# Patient Record
Sex: Female | Born: 1960 | Race: White | Hispanic: No | Marital: Married | Smoking: Never smoker
Health system: Southern US, Community
[De-identification: ages and names within clinical notes are randomized; demographics above are authoritative.]

## PROBLEM LIST (undated history)

## (undated) DIAGNOSIS — F329 Major depressive disorder, single episode, unspecified: Secondary | ICD-10-CM

## (undated) DIAGNOSIS — R87810 Cervical high risk human papillomavirus (HPV) DNA test positive: Secondary | ICD-10-CM

## (undated) DIAGNOSIS — R87610 Atypical squamous cells of undetermined significance on cytologic smear of cervix (ASC-US): Secondary | ICD-10-CM

## (undated) DIAGNOSIS — L659 Nonscarring hair loss, unspecified: Secondary | ICD-10-CM

## (undated) DIAGNOSIS — B977 Papillomavirus as the cause of diseases classified elsewhere: Secondary | ICD-10-CM

## (undated) DIAGNOSIS — M069 Rheumatoid arthritis, unspecified: Secondary | ICD-10-CM

## (undated) DIAGNOSIS — B009 Herpesviral infection, unspecified: Secondary | ICD-10-CM

## (undated) DIAGNOSIS — IMO0002 Reserved for concepts with insufficient information to code with codable children: Secondary | ICD-10-CM

## (undated) DIAGNOSIS — M8580 Other specified disorders of bone density and structure, unspecified site: Secondary | ICD-10-CM

## (undated) DIAGNOSIS — F32A Depression, unspecified: Secondary | ICD-10-CM

## (undated) HISTORY — PX: COLONOSCOPY: SHX174

## (undated) HISTORY — PX: BREAST SURGERY: SHX581

## (undated) HISTORY — DX: Other specified disorders of bone density and structure, unspecified site: M85.80

## (undated) HISTORY — DX: Major depressive disorder, single episode, unspecified: F32.9

## (undated) HISTORY — DX: Nonscarring hair loss, unspecified: L65.9

## (undated) HISTORY — DX: Cervical high risk human papillomavirus (HPV) DNA test positive: R87.810

## (undated) HISTORY — DX: Atypical squamous cells of undetermined significance on cytologic smear of cervix (ASC-US): R87.610

## (undated) HISTORY — DX: Reserved for concepts with insufficient information to code with codable children: IMO0002

## (undated) HISTORY — PX: BREAST EXCISIONAL BIOPSY: SUR124

## (undated) HISTORY — DX: Depression, unspecified: F32.A

## (undated) HISTORY — DX: Rheumatoid arthritis, unspecified: M06.9

## (undated) HISTORY — DX: Herpesviral infection, unspecified: B00.9

## (undated) HISTORY — DX: Papillomavirus as the cause of diseases classified elsewhere: B97.7

---

## 1998-05-22 ENCOUNTER — Other Ambulatory Visit: Admission: RE | Admit: 1998-05-22 | Discharge: 1998-05-22 | Payer: Self-pay | Admitting: Gynecology

## 1999-07-06 ENCOUNTER — Other Ambulatory Visit: Admission: RE | Admit: 1999-07-06 | Discharge: 1999-07-06 | Payer: Self-pay | Admitting: Obstetrics and Gynecology

## 2000-10-24 DIAGNOSIS — M069 Rheumatoid arthritis, unspecified: Secondary | ICD-10-CM

## 2000-10-24 HISTORY — PX: FINGER SURGERY: SHX640

## 2000-10-24 HISTORY — DX: Rheumatoid arthritis, unspecified: M06.9

## 2001-03-02 ENCOUNTER — Ambulatory Visit (HOSPITAL_BASED_OUTPATIENT_CLINIC_OR_DEPARTMENT_OTHER): Admission: RE | Admit: 2001-03-02 | Discharge: 2001-03-02 | Payer: Self-pay | Admitting: Orthopedic Surgery

## 2001-03-02 ENCOUNTER — Encounter (INDEPENDENT_AMBULATORY_CARE_PROVIDER_SITE_OTHER): Payer: Self-pay | Admitting: Specialist

## 2006-01-11 ENCOUNTER — Other Ambulatory Visit: Admission: RE | Admit: 2006-01-11 | Discharge: 2006-01-11 | Payer: Self-pay | Admitting: Gynecology

## 2007-01-24 ENCOUNTER — Other Ambulatory Visit: Admission: RE | Admit: 2007-01-24 | Discharge: 2007-01-24 | Payer: Self-pay | Admitting: Gynecology

## 2008-01-31 ENCOUNTER — Other Ambulatory Visit: Admission: RE | Admit: 2008-01-31 | Discharge: 2008-01-31 | Payer: Self-pay | Admitting: Gynecology

## 2008-11-26 ENCOUNTER — Ambulatory Visit: Payer: Self-pay | Admitting: Gynecology

## 2008-11-27 ENCOUNTER — Ambulatory Visit: Payer: Self-pay | Admitting: Gynecology

## 2009-01-27 ENCOUNTER — Ambulatory Visit: Payer: Self-pay | Admitting: Gynecology

## 2009-02-04 ENCOUNTER — Encounter: Payer: Self-pay | Admitting: Gynecology

## 2009-02-04 ENCOUNTER — Other Ambulatory Visit: Admission: RE | Admit: 2009-02-04 | Discharge: 2009-02-04 | Payer: Self-pay | Admitting: Gynecology

## 2009-02-04 ENCOUNTER — Ambulatory Visit: Payer: Self-pay | Admitting: Gynecology

## 2010-02-16 ENCOUNTER — Ambulatory Visit: Payer: Self-pay | Admitting: Gynecology

## 2010-02-16 ENCOUNTER — Other Ambulatory Visit: Admission: RE | Admit: 2010-02-16 | Discharge: 2010-02-16 | Payer: Self-pay | Admitting: Gynecology

## 2010-02-17 ENCOUNTER — Ambulatory Visit: Payer: Self-pay | Admitting: Gynecology

## 2010-02-23 ENCOUNTER — Encounter: Admission: RE | Admit: 2010-02-23 | Discharge: 2010-02-23 | Payer: Self-pay | Admitting: Gynecology

## 2010-02-24 ENCOUNTER — Ambulatory Visit: Payer: Self-pay | Admitting: Gynecology

## 2010-08-24 HISTORY — PX: CERVICAL BIOPSY  W/ LOOP ELECTRODE EXCISION: SUR135

## 2010-08-31 ENCOUNTER — Other Ambulatory Visit: Admission: RE | Admit: 2010-08-31 | Discharge: 2010-08-31 | Payer: Self-pay | Admitting: Gynecology

## 2010-08-31 ENCOUNTER — Ambulatory Visit: Payer: Self-pay | Admitting: Gynecology

## 2010-09-06 ENCOUNTER — Ambulatory Visit: Payer: Self-pay | Admitting: Gynecology

## 2010-09-20 ENCOUNTER — Ambulatory Visit: Payer: Self-pay | Admitting: Gynecology

## 2010-11-10 ENCOUNTER — Ambulatory Visit
Admission: RE | Admit: 2010-11-10 | Discharge: 2010-11-10 | Payer: Self-pay | Source: Home / Self Care | Attending: Gynecology | Admitting: Gynecology

## 2010-11-14 ENCOUNTER — Encounter: Payer: Self-pay | Admitting: Gynecology

## 2010-12-29 ENCOUNTER — Other Ambulatory Visit (INDEPENDENT_AMBULATORY_CARE_PROVIDER_SITE_OTHER): Payer: Managed Care, Other (non HMO)

## 2010-12-29 DIAGNOSIS — Z01419 Encounter for gynecological examination (general) (routine) without abnormal findings: Secondary | ICD-10-CM

## 2011-02-21 ENCOUNTER — Other Ambulatory Visit: Payer: Self-pay | Admitting: Gynecology

## 2011-02-21 ENCOUNTER — Other Ambulatory Visit (HOSPITAL_COMMUNITY)
Admission: RE | Admit: 2011-02-21 | Discharge: 2011-02-21 | Disposition: A | Discharge status: Home / Self Care | Payer: Managed Care, Other (non HMO) | Source: Ambulatory Visit | Attending: Gynecology | Admitting: Gynecology

## 2011-02-21 ENCOUNTER — Encounter: Payer: Managed Care, Other (non HMO) | Admitting: Gynecology

## 2011-02-21 ENCOUNTER — Encounter (INDEPENDENT_AMBULATORY_CARE_PROVIDER_SITE_OTHER): Payer: Managed Care, Other (non HMO) | Admitting: Gynecology

## 2011-02-21 DIAGNOSIS — Z01419 Encounter for gynecological examination (general) (routine) without abnormal findings: Secondary | ICD-10-CM

## 2011-02-21 DIAGNOSIS — Z833 Family history of diabetes mellitus: Secondary | ICD-10-CM

## 2011-02-21 DIAGNOSIS — R82998 Other abnormal findings in urine: Secondary | ICD-10-CM

## 2011-02-21 DIAGNOSIS — N912 Amenorrhea, unspecified: Secondary | ICD-10-CM

## 2011-02-21 DIAGNOSIS — Z124 Encounter for screening for malignant neoplasm of cervix: Secondary | ICD-10-CM | POA: Insufficient documentation

## 2011-02-21 DIAGNOSIS — Z13220 Encounter for screening for lipoid disorders: Secondary | ICD-10-CM

## 2011-02-24 ENCOUNTER — Other Ambulatory Visit (INDEPENDENT_AMBULATORY_CARE_PROVIDER_SITE_OTHER): Payer: Managed Care, Other (non HMO)

## 2011-02-24 DIAGNOSIS — R82998 Other abnormal findings in urine: Secondary | ICD-10-CM

## 2011-03-11 NOTE — Op Note (Signed)
[place]. [facility]  Patient:    Tiffany Jefferson, Tiffany Jefferson                  MRN: [id number]. Date: 03/02/01 Attending:  Katy Fitch. Naaman Plummer., M.D. CC:         Kinnie Scales. Annalee Genta, M.D.   Operative Report  PREOPERATIVE DIAGNOSIS:  Enlarging mass right index finger, rule out granuloma annulare associated with arthritis disorder with positive ANA.  POSTOPERATIVE DIAGNOSIS:  Enlarging mass right index finger, rule out granuloma annulare associated with arthritis disorder with positive ANA.  OPERATION:  Excision biopsy of multilobular mass right index finger, distal phalangeal segment dorsal to radial proper digital artery and nerve and over palmar aspect of insertion of flexor digitorum profundus.  OPERATING SURGEON:  Katy Fitch. Sypher, Montez Hageman., M.D.  ASSISTANT:  Marveen Reeks Dasnoit, P.A.-C.  ANESTHESIA:  General by LMA.  SUPERVISING ANESTHESIOLOGIST:  Cliffton Asters. Ivin Booty, M.D.  INDICATIONS:  Tiffany Jefferson is a 50 year old woman, who presented for evaluation of a firm mass in the right index finger and swelling of her left ring finger PIP joint.  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  Laboratory studies including an ANA were obtained, which documented an elevated ANA at a dilution to 1:640.  Arrangements were then made for an academic rheumatology consult at [facility] with Dr. Stoney Bang.  We recommended proceeding with a biopsy of her mass at this time for diagnosis and hopefully a resolution of her mass predicament.  DESCRIPTION OF PROCEDURE:  [name] was brought to the operating room and placed in supine position on the operating table.  Following digital block of her finger, she was rather uncomfortable, therefore Dr. Ivin Booty proceeded with general anesthesia by LMA. The right arm was prepped with Betadine soap solution and sterilely draped.  A pneumatic tourniquet was applied to the proximal  forearm.  Following exsanguination of limb with direct compression, the arterial tourniquet was inflated to 220 mmHg.  The procedure commenced with a Mercedes type incision directly over the mass on the radial aspect of the index finger just distal to the DIP flexion crease.  Subcutaneous tissues were meticulously dissected and the incision was extended proximally with a Brunners zigzag technique to expose the radial proper digital artery and nerve.  These were dissected on the palmar aspect of the dorsal lobe of the mass and carefully protected.  The mass was excised from the deep surface of the dermis and was adherent to the joint capsule.  This had the features of a granuloma annulare.  This mass was passed off for pathologic evaluation in formalin.  A second mass was noted on the palmar aspect of the finger at the insertion of the profundus tendon.  This was likewise dissected, taking care to protect the radial and ulnar neurovascular bundles.  This was passed off in formalin for pathologic evaluation.  The wound was then inspected for bleeding points, which were electrocauterized with bipolar forceps, followed by repair of the skin with interrupted sutures of 5-0 nylon.  A compressive dressing was applied with Xeroflo, sterile gauze and Coban.  There were no apparent complications. DD:  03/02/01 TD:  03/02/01 Job: [id number]

## 2011-03-12 IMAGING — US US BREAST R
1 series · 8 of 8 positions shown · non-contrast
Comparison: 02/11/2008 mammogram from [REDACTED] as well as
previous prior ultrasounds from [REDACTED] dated 02/18/2008 and
08/13/2007.

CLINICAL DATA: History of fibrocystic breast disease.  The patient
states that her referring physician palpates a nodule within the
right breast located at the 12 o'clock position.

DIGITAL DIAGNOSTIC BILATERAL MAMMOGRAM WITH CAD AND RIGHT BREAST
ULTRASOUND:

[Series 1: us breast right · 8 of 8 slices shown]
[im 1/8]
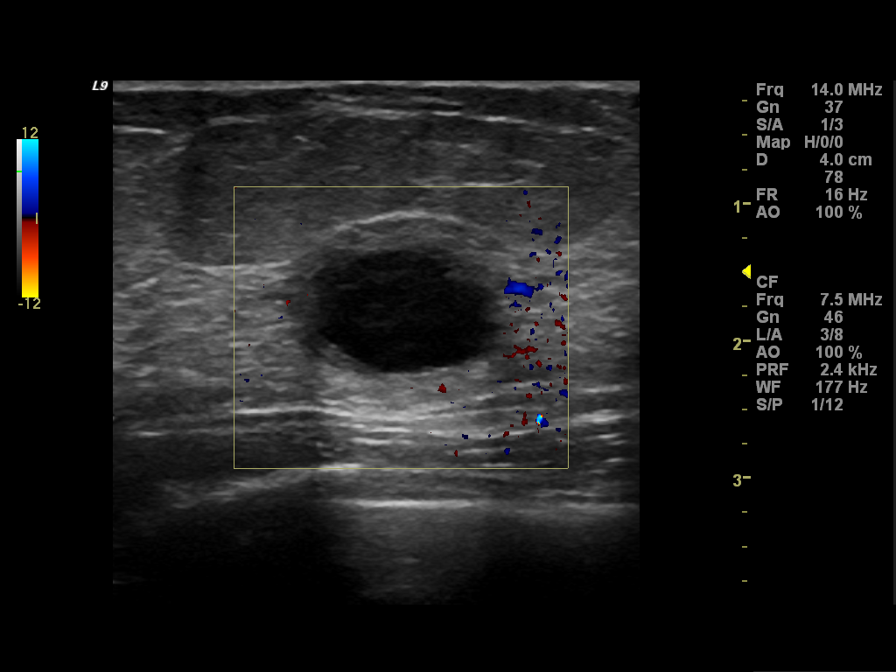
[im 2/8]
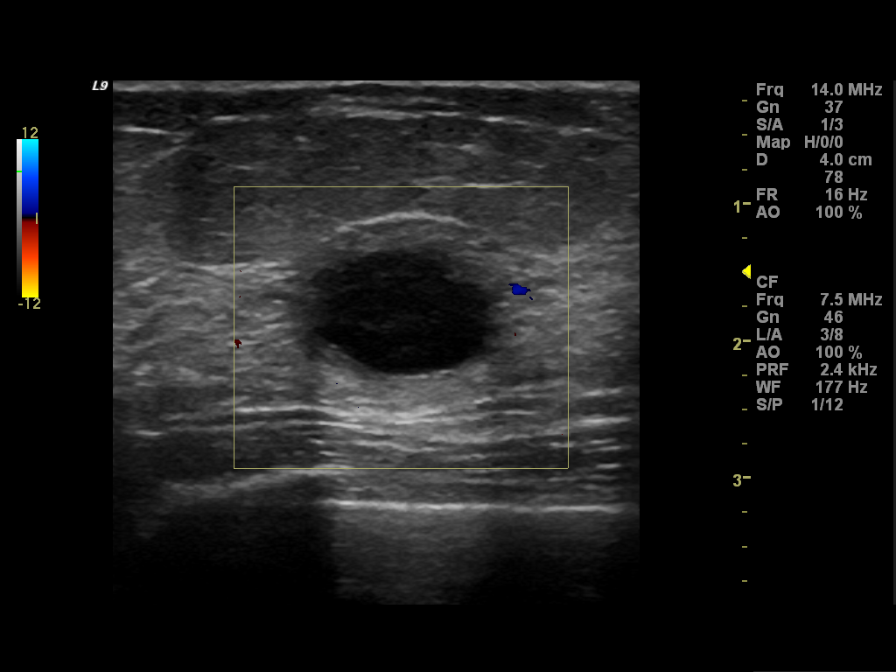
[im 3/8]
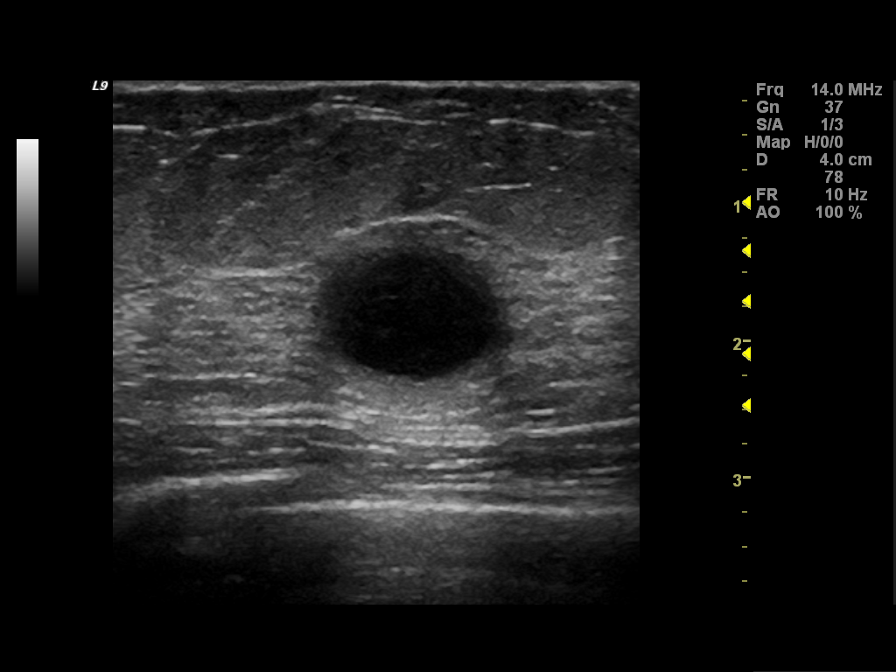
[im 4/8]
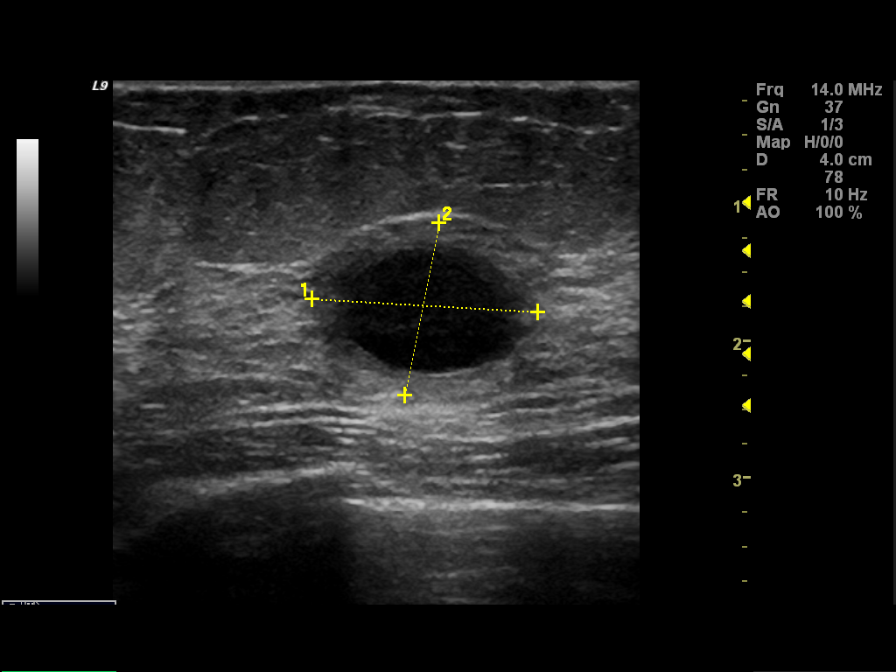
[im 5/8]
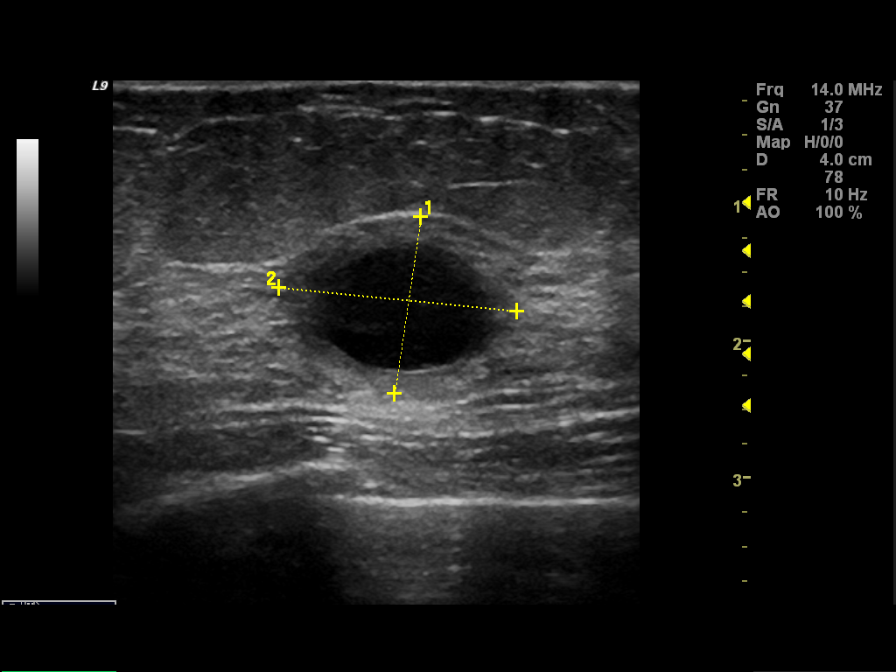
[im 6/8]
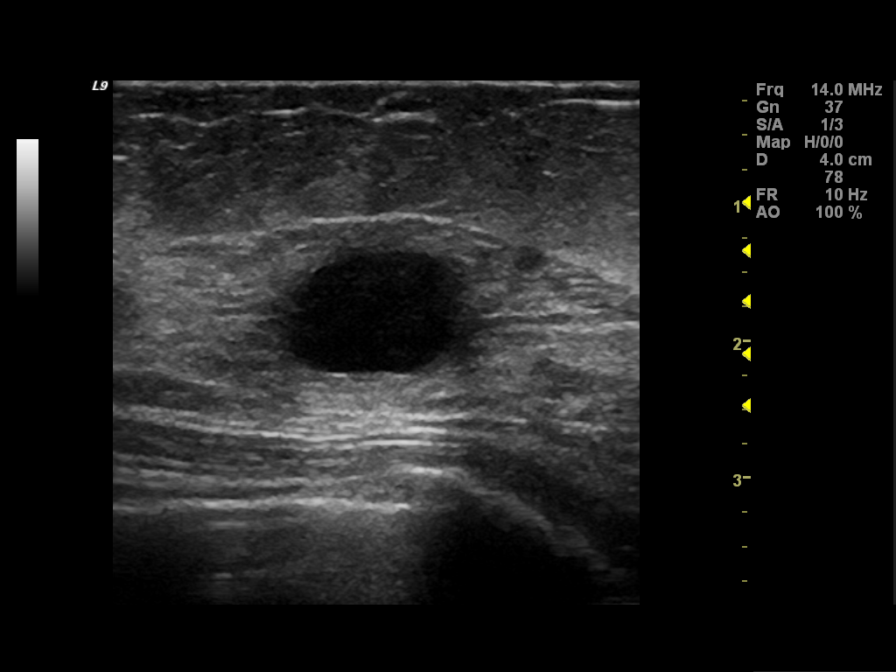
[im 7/8]
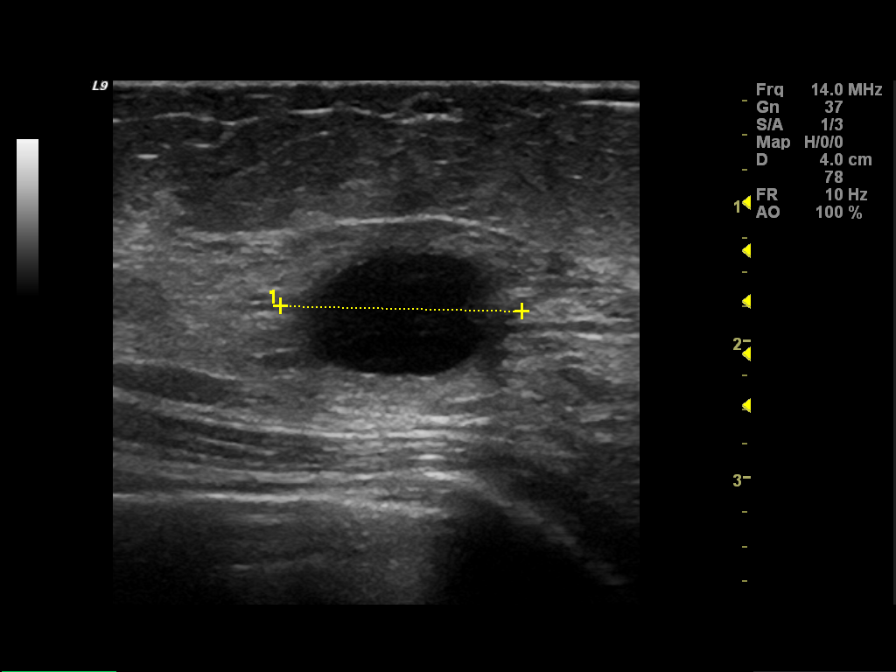
[im 8/8]
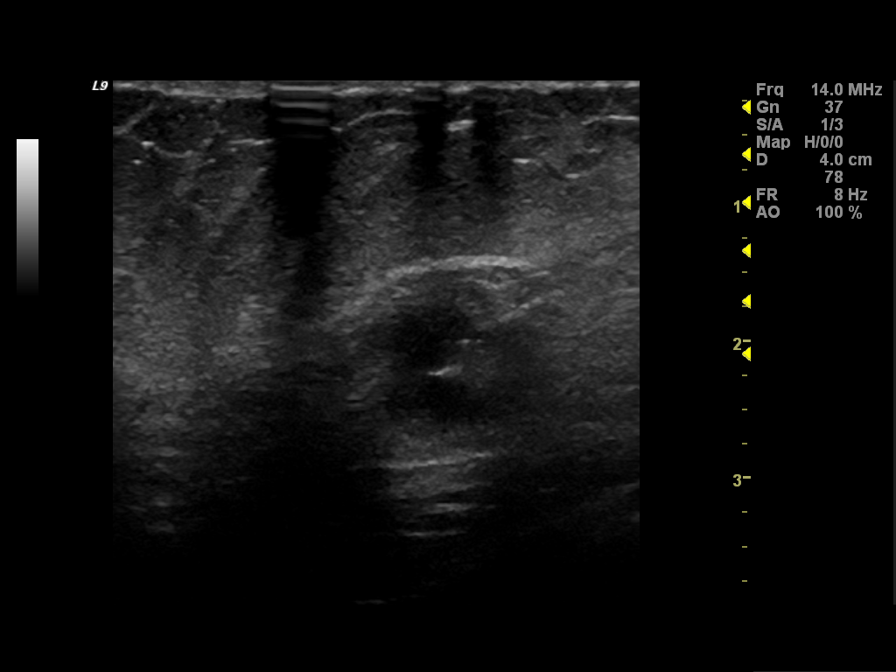

[8 of 8 positions shown; findings below may reference images not displayed]

FINDINGS: There is a heterogeneously dense breast parenchymal
pattern present which is stable when compared to the prior study.
There is no distortion or worrisome calcification within either
breast.
Mammographic images were processed with CAD.

On physical exam, there is a palpable nodule located within the
right breast at the 12 o'clock position 5 cm from the nipple.  This
measures approximately 2 cm in size by physical examination.

Ultrasound is performed, showing a complicated cyst located within
the right breast at 12 o'clock position 5 cm from the nipple with
diffuse wall thickening and internal echoes.  There is no mural
nodularity associated with this cyst. This measures 1.7 x 1.7 x
cm in size. I do recommend cyst aspiration.  This will be performed
and reported separately.
IMPRESSION: 1.  The palpable mass located within the right breast at the 12
o'clock position corresponds to a complicated cyst measuring 1.7 cm
in size.  Cyst aspiration is recommended and will be performed and
reported separately.

BI-RADS CATEGORY 3:  Probably benign finding(s) - short interval
follow-up suggested.

## 2011-03-25 ENCOUNTER — Other Ambulatory Visit: Payer: Self-pay | Admitting: Dermatology

## 2011-08-24 ENCOUNTER — Telehealth: Payer: Self-pay | Admitting: *Deleted

## 2011-08-24 NOTE — Telephone Encounter (Signed)
Okay to just have labs done

## 2011-08-24 NOTE — Telephone Encounter (Signed)
Pt called requesting to get some labs drawn in office, she states this is what she does every six weeks to send to her rheumatologist due to her rheumatoid arthritis. Please advise if pt can have just labs done.

## 2011-08-25 ENCOUNTER — Other Ambulatory Visit: Payer: Self-pay | Admitting: *Deleted

## 2011-08-25 DIAGNOSIS — Z139 Encounter for screening, unspecified: Secondary | ICD-10-CM

## 2011-08-25 NOTE — Telephone Encounter (Signed)
Pt informed with the below note, order placed in computer for cbc with diff, alt/sgpt, gamma gt as previously order before to send to Dr. Selmer [place] office.

## 2011-08-25 NOTE — Telephone Encounter (Signed)
Lm for pt to call

## 2011-08-29 ENCOUNTER — Other Ambulatory Visit (INDEPENDENT_AMBULATORY_CARE_PROVIDER_SITE_OTHER): Payer: Managed Care, Other (non HMO) | Admitting: *Deleted

## 2011-08-29 DIAGNOSIS — Z139 Encounter for screening, unspecified: Secondary | ICD-10-CM

## 2011-08-29 LAB — ALT: ALT: 15 U/L (ref 0–35)

## 2011-10-10 ENCOUNTER — Ambulatory Visit (INDEPENDENT_AMBULATORY_CARE_PROVIDER_SITE_OTHER): Payer: Managed Care, Other (non HMO) | Admitting: *Deleted

## 2011-10-10 DIAGNOSIS — M069 Rheumatoid arthritis, unspecified: Secondary | ICD-10-CM

## 2011-10-10 LAB — GAMMA GT: GGT: 11 U/L (ref 7–51)

## 2011-11-18 ENCOUNTER — Other Ambulatory Visit: Payer: Self-pay | Admitting: *Deleted

## 2011-11-18 DIAGNOSIS — M069 Rheumatoid arthritis, unspecified: Secondary | ICD-10-CM

## 2011-11-21 ENCOUNTER — Other Ambulatory Visit: Payer: Managed Care, Other (non HMO)

## 2011-11-21 DIAGNOSIS — M069 Rheumatoid arthritis, unspecified: Secondary | ICD-10-CM

## 2011-11-21 LAB — CBC WITH DIFFERENTIAL/PLATELET
Basophils Absolute: 0 10*3/uL (ref 0.0–0.1)
Basophils Relative: 1 % (ref 0–1)
HCT: 39.9 % (ref 36.0–46.0)
Hemoglobin: 13 g/dL (ref 12.0–15.0)
Lymphocytes Relative: 25 % (ref 12–46)
MCH: 33.5 pg (ref 26.0–34.0)
MCV: 102.8 fL — ABNORMAL HIGH (ref 78.0–100.0)
Neutro Abs: 3.7 10*3/uL (ref 1.7–7.7)
Platelets: 213 10*3/uL (ref 150–400)
RBC: 3.88 MIL/uL (ref 3.87–5.11)
WBC: 5.5 10*3/uL (ref 4.0–10.5)

## 2012-01-02 ENCOUNTER — Other Ambulatory Visit: Payer: Managed Care, Other (non HMO)

## 2012-01-02 DIAGNOSIS — M069 Rheumatoid arthritis, unspecified: Secondary | ICD-10-CM

## 2012-01-02 LAB — CBC WITH DIFFERENTIAL/PLATELET
Basophils Absolute: 0 10*3/uL (ref 0.0–0.1)
Basophils Relative: 0 % (ref 0–1)
Eosinophils Absolute: 0 10*3/uL (ref 0.0–0.7)
HCT: 39.3 % (ref 36.0–46.0)
Lymphocytes Relative: 16 % (ref 12–46)
MCH: 33.6 pg (ref 26.0–34.0)
MCV: 100.8 fL — ABNORMAL HIGH (ref 78.0–100.0)
Monocytes Relative: 7 % (ref 3–12)
Neutro Abs: 4.3 10*3/uL (ref 1.7–7.7)
Neutrophils Relative %: 76 % (ref 43–77)
Platelets: 260 10*3/uL (ref 150–400)
RBC: 3.9 MIL/uL (ref 3.87–5.11)
RDW: 13.8 % (ref 11.5–15.5)

## 2012-01-03 ENCOUNTER — Telehealth: Payer: Self-pay | Admitting: *Deleted

## 2012-01-03 MED ORDER — ALPRAZOLAM 0.5 MG PO TABS: 0.5000 mg | ORAL_TABLET | Freq: Three times a day (TID) | ORAL | Status: DC | PRN

## 2012-01-03 NOTE — Telephone Encounter (Signed)
Pt called [place] going through a crisis, seeing her counselor for this. Has seen him 4 out of 7 days but counselor cannot write a prescription for Xanax that she being suggested she needs. She has given me the name and # of her counselor that I can verify this information with. Is a prescription for Xanax ok to give?

## 2012-01-03 NOTE — Telephone Encounter (Signed)
May start patient on Xanax 0.5 mg q daily #30 refill x 3. Make sure we get documentation from counselor.

## 2012-01-03 NOTE — Telephone Encounter (Signed)
Pt was left a message, rx sent and call placed to counselor to obtain records or a note stating prescription advised., prescription called in

## 2012-01-25 NOTE — Telephone Encounter (Signed)
Pt has OV 4/5 with Dr Audie Box and will follow up then.

## 2012-01-27 ENCOUNTER — Encounter: Payer: Self-pay | Admitting: Gynecology

## 2012-01-27 ENCOUNTER — Ambulatory Visit (INDEPENDENT_AMBULATORY_CARE_PROVIDER_SITE_OTHER): Payer: Managed Care, Other (non HMO) | Admitting: Gynecology

## 2012-01-27 DIAGNOSIS — Z30432 Encounter for removal of intrauterine contraceptive device: Secondary | ICD-10-CM

## 2012-01-27 NOTE — Progress Notes (Signed)
Patient presents to have her IUD removed. It was placed May 2008 and she is at the 5 year interval. She recently has reinitiating relationships with her ex-husband who has had a vasectomy. She knows he can feel the string and she wants to have it removed now.  Exam Sherrilyn Rist chaperone present External BUS vagina normal. Cervix normal IUD string visualized. Cervix anteverted normal size midline mobile nontender. Adnexa without masses or tenderness  IUD string was grasped with a [place] forcep and removed without difficulty. Her Mirena IUD was shown to her and discarded.  Patient will follow up in May when she is due for her annual exam that she has scheduled.

## 2012-01-27 NOTE — Patient Instructions (Signed)
Follow up up at annual exam appointment

## 2012-02-13 ENCOUNTER — Other Ambulatory Visit: Payer: Managed Care, Other (non HMO)

## 2012-02-13 DIAGNOSIS — M069 Rheumatoid arthritis, unspecified: Secondary | ICD-10-CM

## 2012-02-13 LAB — CBC WITH DIFFERENTIAL/PLATELET
Basophils Absolute: 0 10*3/uL (ref 0.0–0.1)
Basophils Relative: 1 % (ref 0–1)
Eosinophils Relative: 1 % (ref 0–5)
HCT: 40.2 % (ref 36.0–46.0)
Hemoglobin: 12.8 g/dL (ref 12.0–15.0)
MCH: 32.8 pg (ref 26.0–34.0)
MCHC: 31.8 g/dL (ref 30.0–36.0)
Monocytes Absolute: 0.4 10*3/uL (ref 0.1–1.0)
Monocytes Relative: 8 % (ref 3–12)
Neutro Abs: 3.1 10*3/uL (ref 1.7–7.7)
RDW: 13.2 % (ref 11.5–15.5)

## 2012-02-13 LAB — GAMMA GT: GGT: 12 U/L (ref 7–51)

## 2012-03-02 ENCOUNTER — Encounter: Payer: Self-pay | Admitting: Gynecology

## 2012-03-02 ENCOUNTER — Ambulatory Visit (INDEPENDENT_AMBULATORY_CARE_PROVIDER_SITE_OTHER): Payer: Managed Care, Other (non HMO) | Admitting: Gynecology

## 2012-03-02 ENCOUNTER — Other Ambulatory Visit (HOSPITAL_COMMUNITY)
Admission: RE | Admit: 2012-03-02 | Discharge: 2012-03-02 | Disposition: A | Discharge status: Home / Self Care | Payer: Managed Care, Other (non HMO) | Source: Ambulatory Visit | Attending: Gynecology | Admitting: Gynecology

## 2012-03-02 VITALS — BP 100/60 | Ht 62.75 in | Wt 130.0 lb

## 2012-03-02 DIAGNOSIS — R87810 Cervical high risk human papillomavirus (HPV) DNA test positive: Secondary | ICD-10-CM | POA: Insufficient documentation

## 2012-03-02 DIAGNOSIS — Z01419 Encounter for gynecological examination (general) (routine) without abnormal findings: Secondary | ICD-10-CM | POA: Insufficient documentation

## 2012-03-02 DIAGNOSIS — Z131 Encounter for screening for diabetes mellitus: Secondary | ICD-10-CM

## 2012-03-02 DIAGNOSIS — N912 Amenorrhea, unspecified: Secondary | ICD-10-CM

## 2012-03-02 DIAGNOSIS — Z13220 Encounter for screening for lipoid disorders: Secondary | ICD-10-CM

## 2012-03-02 LAB — CBC WITH DIFFERENTIAL/PLATELET
Eosinophils Absolute: 0.1 10*3/uL (ref 0.0–0.7)
Eosinophils Relative: 1 % (ref 0–5)
Hemoglobin: 13.2 g/dL (ref 12.0–15.0)
Lymphocytes Relative: 25 % (ref 12–46)
MCH: 33.2 pg (ref 26.0–34.0)
MCHC: 32.4 g/dL (ref 30.0–36.0)
Neutro Abs: 4.1 10*3/uL (ref 1.7–7.7)
Neutrophils Relative %: 63 % (ref 43–77)
Platelets: 258 10*3/uL (ref 150–400)
RBC: 3.97 MIL/uL (ref 3.87–5.11)
RDW: 13.4 % (ref 11.5–15.5)
WBC: 6.4 10*3/uL (ref 4.0–10.5)

## 2012-03-02 LAB — LIPID PANEL
Cholesterol: 123 mg/dL (ref 0–200)
HDL: 46 mg/dL (ref >39)
Triglycerides: 100 mg/dL (ref <150)
VLDL: 20 mg/dL (ref 0–40)

## 2012-03-02 LAB — COMPREHENSIVE METABOLIC PANEL
ALT: 12 U/L (ref 0–35)
AST: 16 U/L (ref 0–37)
CO2: 27 mEq/L (ref 19–32)
Calcium: 9.2 mg/dL (ref 8.4–10.5)
Chloride: 104 mEq/L (ref 96–112)
Creat: 0.74 mg/dL (ref 0.50–1.10)
Glucose, Bld: 89 mg/dL (ref 70–99)
Potassium: 3.7 mEq/L (ref 3.5–5.3)
Total Protein: 6.7 g/dL (ref 6.0–8.3)

## 2012-03-02 NOTE — Progress Notes (Signed)
Tiffany Jefferson 29-Jun-1961 [id number]        51 y.o.  for annual exam.  Several issues noted below.  Past medical history,surgical history, medications, allergies, family history and social history were all reviewed and documented in the EPIC chart. ROS:  Was performed and pertinent positives and negatives are included in the history.  Exam: Amy chaperone present Filed Vitals:   03/02/12 1129  BP: 100/60   General appearance  Normal Skin grossly normal Head/Neck normal with no cervical or supraclavicular adenopathy thyroid normal Lungs  clear Cardiac RR, without RMG Abdominal  soft, nontender, without masses, organomegaly or hernia Breasts  examined lying and sitting without masses, retractions, discharge or axillary adenopathy. Pelvic  Ext/BUS/vagina  normal   Cervix  normal Pap done  Uterus  anteverted, normal size, shape and contour, midline and mobile nontender   Adnexa  Without masses or tenderness    Anus and perineum  normal   Rectovaginal  normal sphincter tone without palpated masses or tenderness.    Assessment/Plan:  51 y.o. female for annual exam.    1. Recently had IUD removed. Birth control not an issue. Will keep menstrual calendar. Was amenorrheic before and we'll see if she remains amenorrheic. We'll check baseline FSH today. 2. History of CIN-2 with LEEP positive endocervical margin 2011. Pap smear 2012 normal. Pap done today with HPV screen. Assuming normal then repeat in a year.  She is immunosuppressed on methotrexate and Plaquenil due to her rheumatoid arthritis. 3. Mammography. She is overdue for her mammogram and knows the importance of scheduling this and agrees to do so.  SBE monthly reviewed. 4. Health maintenance. Check baseline CBC contents metabolic panel lipid profile urinalysis.    Dara Lords MD, 11:57 AM 03/02/2012

## 2012-03-02 NOTE — Patient Instructions (Signed)
Follow up for lab work. Keep menstrual calendar this coming year. Follow up in one year for annual gynecologic exam

## 2012-03-03 LAB — URINALYSIS W MICROSCOPIC + REFLEX CULTURE
Bacteria, UA: NONE SEEN
Bilirubin Urine: NEGATIVE
Casts: NONE SEEN
Hgb urine dipstick: NEGATIVE
Ketones, ur: NEGATIVE mg/dL
Protein, ur: NEGATIVE mg/dL
Squamous Epithelial / LPF: NONE SEEN
Urobilinogen, UA: 0.2 mg/dL (ref 0.0–1.0)
pH: 5.5 (ref 5.0–8.0)

## 2012-03-05 ENCOUNTER — Other Ambulatory Visit: Payer: Self-pay | Admitting: *Deleted

## 2012-03-05 DIAGNOSIS — N6002 Solitary cyst of left breast: Secondary | ICD-10-CM

## 2012-03-05 DIAGNOSIS — N6001 Solitary cyst of right breast: Secondary | ICD-10-CM

## 2012-03-06 NOTE — Progress Notes (Signed)
Addended by: Venora Maples on: 03/06/2012 10:29 AM   Modules accepted: Orders

## 2012-03-09 ENCOUNTER — Ambulatory Visit (INDEPENDENT_AMBULATORY_CARE_PROVIDER_SITE_OTHER): Payer: Managed Care, Other (non HMO) | Admitting: Gynecology

## 2012-03-09 ENCOUNTER — Encounter: Payer: Self-pay | Admitting: Gynecology

## 2012-03-09 DIAGNOSIS — IMO0002 Reserved for concepts with insufficient information to code with codable children: Secondary | ICD-10-CM

## 2012-03-09 DIAGNOSIS — R87810 Cervical high risk human papillomavirus (HPV) DNA test positive: Secondary | ICD-10-CM

## 2012-03-09 DIAGNOSIS — R87612 Low grade squamous intraepithelial lesion on cytologic smear of cervix (LGSIL): Secondary | ICD-10-CM

## 2012-03-09 NOTE — Progress Notes (Signed)
Addended by: Dayna Barker on: 03/09/2012 04:55 PM   Modules accepted: Orders

## 2012-03-09 NOTE — Progress Notes (Signed)
Patient ID: Tiffany Jefferson, female   DOB: [birth year], 51 y.o.   MRN: [id number]  Patient presents with history of CIN-2 with LEEP positive endocervical margin 2011. Pap smear 2012 normal.  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 Patient presents for colposcopy.  Exam with Selena Batten chaperone present  External BUS vagina normal. Cervix grossly normal. Colposcopy inadequate after acetic acid cleanse with transformation zone not well visualized including the use of the endocervical speculum.  Area of acetowhite change at 12:00. ECC and cervical biopsy at 12:00 taken.  Physical Exam  Genitourinary:    Assessment and plan: History of high-grade dysplasia status post LEEP 2011 with positive endocervical margin. Follow up Pap smear 2012 normal. Recent Pap smear with low-grade SIL positive high risk HPV. Colposcopy is inadequate with acetowhite change at 12:00. ECC and representative biopsy at 12:00 taken. I reviewed the situation with the patient and the options for management. She is being treated with immunosuppression for her rheumatoid arthritis but I think this is contributing to her persistent HPV infection. Possibilities to include expectant management, LEEP, hysterectomy were reviewed. We'll rediscuss after biopsy results return.

## 2012-03-09 NOTE — Patient Instructions (Signed)
Office will call you with biopsy results 

## 2012-03-14 ENCOUNTER — Telehealth: Payer: Self-pay | Admitting: Gynecology

## 2012-03-14 MED ORDER — DOXYCYCLINE HYCLATE 100 MG PO TABS: 100.0000 mg | ORAL_TABLET | Freq: Two times a day (BID) | ORAL | Status: AC

## 2012-03-14 NOTE — Telephone Encounter (Signed)
Pt informed with the below note. 

## 2012-03-14 NOTE — Telephone Encounter (Signed)
Tell patient pathology shows inflammation but no atypia.  I want to treat her with antibiotics and recheck pap smear in 6 months.  Order placed

## 2012-03-14 NOTE — Telephone Encounter (Signed)
Left message for pt to call.

## 2012-03-16 ENCOUNTER — Ambulatory Visit
Admission: RE | Admit: 2012-03-16 | Discharge: 2012-03-16 | Disposition: A | Discharge status: Home / Self Care | Payer: Managed Care, Other (non HMO) | Source: Ambulatory Visit | Attending: Gynecology | Admitting: Gynecology

## 2012-03-16 DIAGNOSIS — N6001 Solitary cyst of right breast: Secondary | ICD-10-CM

## 2012-03-26 ENCOUNTER — Other Ambulatory Visit: Payer: Managed Care, Other (non HMO) | Admitting: Gynecology

## 2012-03-26 DIAGNOSIS — M069 Rheumatoid arthritis, unspecified: Secondary | ICD-10-CM

## 2012-03-26 LAB — CBC WITH DIFFERENTIAL/PLATELET
Basophils Absolute: 0.1 10*3/uL (ref 0.0–0.1)
Basophils Relative: 1 % (ref 0–1)
Hemoglobin: 12.4 g/dL (ref 12.0–15.0)
Lymphocytes Relative: 19 % (ref 12–46)
MCHC: 33.9 g/dL (ref 30.0–36.0)
Monocytes Relative: 7 % (ref 3–12)
Neutro Abs: 4.4 10*3/uL (ref 1.7–7.7)
Neutrophils Relative %: 72 % (ref 43–77)
WBC: 6.1 10*3/uL (ref 4.0–10.5)

## 2012-03-26 LAB — ALT: ALT: 12 U/L (ref 0–35)

## 2012-03-26 LAB — GAMMA GT: GGT: 9 U/L (ref 7–51)

## 2012-05-07 ENCOUNTER — Telehealth: Payer: Self-pay | Admitting: *Deleted

## 2012-05-07 ENCOUNTER — Other Ambulatory Visit: Payer: Managed Care, Other (non HMO) | Admitting: Gynecology

## 2012-05-07 DIAGNOSIS — M069 Rheumatoid arthritis, unspecified: Secondary | ICD-10-CM

## 2012-05-07 LAB — CBC WITH DIFFERENTIAL/PLATELET
Basophils Absolute: 0 10*3/uL (ref 0.0–0.1)
Basophils Relative: 1 % (ref 0–1)
Hemoglobin: 12.6 g/dL (ref 12.0–15.0)
MCHC: 34.7 g/dL (ref 30.0–36.0)
Neutro Abs: 3.3 10*3/uL (ref 1.7–7.7)
Neutrophils Relative %: 63 % (ref 43–77)
RDW: 13.1 % (ref 11.5–15.5)
WBC: 5.2 10*3/uL (ref 4.0–10.5)

## 2012-05-07 NOTE — Telephone Encounter (Signed)
Pt came in for repeat lab work cbc, alt, GGT. Order placed for repeat in 2-3 months per pt request, Dr.Fontaine okayed this order back in dec 2012 (see paper chart [facility] form) order placed.

## 2012-06-18 ENCOUNTER — Other Ambulatory Visit: Payer: Managed Care, Other (non HMO) | Admitting: Gynecology

## 2012-06-18 DIAGNOSIS — M069 Rheumatoid arthritis, unspecified: Secondary | ICD-10-CM

## 2012-06-18 LAB — CBC WITH DIFFERENTIAL/PLATELET
Basophils Absolute: 0 10*3/uL (ref 0.0–0.1)
Eosinophils Relative: 3 % (ref 0–5)
HCT: 37.7 % (ref 36.0–46.0)
Lymphocytes Relative: 26 % (ref 12–46)
Lymphs Abs: 1.3 10*3/uL (ref 0.7–4.0)
MCV: 95 fL (ref 78.0–100.0)
Monocytes Absolute: 0.4 10*3/uL (ref 0.1–1.0)
RDW: 13.3 % (ref 11.5–15.5)
WBC: 4.9 10*3/uL (ref 4.0–10.5)

## 2012-07-16 ENCOUNTER — Other Ambulatory Visit: Payer: Managed Care, Other (non HMO) | Admitting: Gynecology

## 2012-07-16 DIAGNOSIS — M069 Rheumatoid arthritis, unspecified: Secondary | ICD-10-CM

## 2012-07-17 LAB — CBC WITH DIFFERENTIAL/PLATELET
Eosinophils Relative: 2 % (ref 0–5)
HCT: 39.2 % (ref 36.0–46.0)
Lymphocytes Relative: 26 % (ref 12–46)
Lymphs Abs: 1.5 10*3/uL (ref 0.7–4.0)
MCV: 97.3 fL (ref 78.0–100.0)
Monocytes Absolute: 0.5 10*3/uL (ref 0.1–1.0)
RBC: 4.03 MIL/uL (ref 3.87–5.11)
WBC: 5.9 10*3/uL (ref 4.0–10.5)

## 2012-08-06 ENCOUNTER — Other Ambulatory Visit: Payer: Managed Care, Other (non HMO) | Admitting: Gynecology

## 2012-08-27 ENCOUNTER — Other Ambulatory Visit: Payer: Managed Care, Other (non HMO) | Admitting: Gynecology

## 2012-08-27 ENCOUNTER — Other Ambulatory Visit: Payer: Self-pay | Admitting: Gynecology

## 2012-08-27 DIAGNOSIS — M069 Rheumatoid arthritis, unspecified: Secondary | ICD-10-CM

## 2012-08-27 LAB — CBC
HCT: 37.3 % (ref 36.0–46.0)
Hemoglobin: 13 g/dL (ref 12.0–15.0)
MCV: 94 fL (ref 78.0–100.0)
RDW: 13 % (ref 11.5–15.5)
WBC: 6 10*3/uL (ref 4.0–10.5)

## 2012-08-28 ENCOUNTER — Telehealth: Payer: Self-pay | Admitting: *Deleted

## 2012-08-28 DIAGNOSIS — M069 Rheumatoid arthritis, unspecified: Secondary | ICD-10-CM

## 2012-08-28 LAB — THYROID PANEL WITH TSH
Free Thyroxine Index: 2.6 (ref 1.0–3.9)
T3 Uptake: 31.5 % (ref 22.5–37.0)
TSH: 5.209 u[IU]/mL — ABNORMAL HIGH (ref 0.350–4.500)

## 2012-08-28 NOTE — Telephone Encounter (Signed)
Okay , Orders put in

## 2012-08-28 NOTE — Telephone Encounter (Signed)
FYI. Pt said that Dr.Hadler in [place] would like TSH, Free T4 AND T3 added to the blood work done yesterday ALT, CBC, GAMMA GT. I checked with lab and she said that this could be added on to blood drawn.

## 2012-08-28 NOTE — Telephone Encounter (Signed)
No order needed for labs, per [place]. 714.0 diag. Code given for labs to be added on.

## 2012-10-08 ENCOUNTER — Other Ambulatory Visit: Payer: Self-pay | Admitting: Gynecology

## 2012-10-08 ENCOUNTER — Other Ambulatory Visit: Payer: Managed Care, Other (non HMO) | Admitting: Gynecology

## 2012-10-08 LAB — CBC WITH DIFFERENTIAL/PLATELET
Hemoglobin: 13.3 g/dL (ref 12.0–15.0)
Lymphocytes Relative: 29 % (ref 12–46)
Lymphs Abs: 1.3 10*3/uL (ref 0.7–4.0)
MCH: 32.9 pg (ref 26.0–34.0)
Monocytes Relative: 7 % (ref 3–12)
Neutro Abs: 2.9 10*3/uL (ref 1.7–7.7)
Neutrophils Relative %: 63 % (ref 43–77)
Platelets: 239 10*3/uL (ref 150–400)
RBC: 4.04 MIL/uL (ref 3.87–5.11)
WBC: 4.6 10*3/uL (ref 4.0–10.5)

## 2012-10-09 ENCOUNTER — Other Ambulatory Visit: Payer: Self-pay | Admitting: *Deleted

## 2012-10-09 DIAGNOSIS — M069 Rheumatoid arthritis, unspecified: Secondary | ICD-10-CM

## 2012-10-09 NOTE — Progress Notes (Signed)
Standing orders placed for pt since RA dr in [facility], Dr TF ok with pt having drawn here. KW

## 2012-11-19 ENCOUNTER — Other Ambulatory Visit: Payer: Managed Care, Other (non HMO) | Admitting: Gynecology

## 2012-11-19 DIAGNOSIS — M069 Rheumatoid arthritis, unspecified: Secondary | ICD-10-CM

## 2012-11-19 LAB — CBC WITH DIFFERENTIAL/PLATELET
Eosinophils Relative: 2 % (ref 0–5)
HCT: 38 % (ref 36.0–46.0)
Lymphocytes Relative: 25 % (ref 12–46)
Lymphs Abs: 1.1 10*3/uL (ref 0.7–4.0)
MCV: 95.2 fL (ref 78.0–100.0)
Monocytes Absolute: 0.4 10*3/uL (ref 0.1–1.0)
RBC: 3.99 MIL/uL (ref 3.87–5.11)
WBC: 4.5 10*3/uL (ref 4.0–10.5)

## 2012-12-08 ENCOUNTER — Other Ambulatory Visit: Payer: Self-pay

## 2012-12-31 ENCOUNTER — Other Ambulatory Visit: Payer: Managed Care, Other (non HMO) | Admitting: Gynecology

## 2012-12-31 DIAGNOSIS — M069 Rheumatoid arthritis, unspecified: Secondary | ICD-10-CM

## 2012-12-31 LAB — CBC WITH DIFFERENTIAL/PLATELET
Basophils Absolute: 0 10*3/uL (ref 0.0–0.1)
Basophils Relative: 1 % (ref 0–1)
MCHC: 35.1 g/dL (ref 30.0–36.0)
Neutro Abs: 2.9 10*3/uL (ref 1.7–7.7)
Neutrophils Relative %: 63 % (ref 43–77)
RDW: 13.8 % (ref 11.5–15.5)

## 2013-01-01 LAB — GAMMA GT: GGT: 16 U/L (ref 7–51)

## 2013-02-11 ENCOUNTER — Other Ambulatory Visit: Payer: Managed Care, Other (non HMO)

## 2013-02-11 DIAGNOSIS — M069 Rheumatoid arthritis, unspecified: Secondary | ICD-10-CM

## 2013-02-11 LAB — CBC WITH DIFFERENTIAL/PLATELET
Basophils Absolute: 0 10*3/uL (ref 0.0–0.1)
Basophils Relative: 1 % (ref 0–1)
Eosinophils Relative: 3 % (ref 0–5)
Lymphocytes Relative: 25 % (ref 12–46)
MCHC: 34.1 g/dL (ref 30.0–36.0)
MCV: 93.7 fL (ref 78.0–100.0)
Neutro Abs: 2.8 10*3/uL (ref 1.7–7.7)
Platelets: 233 10*3/uL (ref 150–400)
RDW: 13.1 % (ref 11.5–15.5)
WBC: 4.5 10*3/uL (ref 4.0–10.5)

## 2013-02-11 LAB — ALT: ALT: 28 U/L (ref 0–35)

## 2013-03-06 ENCOUNTER — Ambulatory Visit (INDEPENDENT_AMBULATORY_CARE_PROVIDER_SITE_OTHER): Payer: Managed Care, Other (non HMO) | Admitting: Gynecology

## 2013-03-06 ENCOUNTER — Other Ambulatory Visit (HOSPITAL_COMMUNITY)
Admission: RE | Admit: 2013-03-06 | Discharge: 2013-03-06 | Disposition: A | Discharge status: Home / Self Care | Payer: Managed Care, Other (non HMO) | Source: Ambulatory Visit | Attending: Gynecology | Admitting: Gynecology

## 2013-03-06 ENCOUNTER — Encounter: Payer: Self-pay | Admitting: Gynecology

## 2013-03-06 VITALS — BP 110/70 | Ht 62.5 in | Wt 134.0 lb

## 2013-03-06 DIAGNOSIS — Z13220 Encounter for screening for lipoid disorders: Secondary | ICD-10-CM

## 2013-03-06 DIAGNOSIS — Z1151 Encounter for screening for human papillomavirus (HPV): Secondary | ICD-10-CM | POA: Insufficient documentation

## 2013-03-06 DIAGNOSIS — R87810 Cervical high risk human papillomavirus (HPV) DNA test positive: Secondary | ICD-10-CM | POA: Insufficient documentation

## 2013-03-06 DIAGNOSIS — Z01419 Encounter for gynecological examination (general) (routine) without abnormal findings: Secondary | ICD-10-CM | POA: Insufficient documentation

## 2013-03-06 DIAGNOSIS — E039 Hypothyroidism, unspecified: Secondary | ICD-10-CM

## 2013-03-06 LAB — COMPREHENSIVE METABOLIC PANEL WITH GFR
ALT: 18 U/L (ref 0–35)
AST: 17 U/L (ref 0–37)
Albumin: 4.2 g/dL (ref 3.5–5.2)
Alkaline Phosphatase: 66 U/L (ref 39–117)
BUN: 9 mg/dL (ref 6–23)
CO2: 24 meq/L (ref 19–32)
Calcium: 9.2 mg/dL (ref 8.4–10.5)
Chloride: 105 meq/L (ref 96–112)
Creat: 0.78 mg/dL (ref 0.50–1.10)
Glucose, Bld: 94 mg/dL (ref 70–99)
Potassium: 4.1 meq/L (ref 3.5–5.3)
Sodium: 139 meq/L (ref 135–145)
Total Bilirubin: 0.5 mg/dL (ref 0.3–1.2)
Total Protein: 6.8 g/dL (ref 6.0–8.3)

## 2013-03-06 LAB — T4, FREE: Free T4: 1.11 ng/dL (ref 0.80–1.80)

## 2013-03-06 NOTE — Progress Notes (Signed)
Tiffany Jefferson December 07, 1960 [id number]        52 y.o.  G2P2 for annual exam.  Several issues noted below.  Past medical history,surgical history, medications, allergies, family history and social history were all reviewed and documented in the EPIC chart. ROS:  Was performed and pertinent positives and negatives are included in the history.  Exam: Kim assistant Filed Vitals:   03/06/13 1002  BP: 110/70  Height: 5' 2.5" (1.588 m)  Weight: 134 lb (60.782 kg)   General appearance  Normal Skin grossly normal Head/Neck normal with no cervical or supraclavicular adenopathy thyroid normal Lungs  clear Cardiac RR, without RMG Abdominal  soft, nontender, without masses, organomegaly or hernia Breasts  examined lying and sitting without masses, retractions, discharge or axillary adenopathy. Pelvic  Ext/BUS/vagina  normal   Cervix  normal Pap HPV  Uterus  anteverted, normal size, shape and contour, midline and mobile nontender   Adnexa  Without masses or tenderness    Anus and perineum  normal   Rectovaginal  normal sphincter tone without palpated masses or tenderness.    Assessment/Plan:  51 y.o. G2P2 female for annual exam.   1. Postmenopausal. No bleeding since removal of her IUD. Elevated FSH of 122 last year. Patient without significant symptoms such as hot flashes night sweats vaginal dryness or irritation. Continue to monitor at this time. Patient knows to report any bleeding. 2. Weight gain. Patient notes weight gain this past year but is having a lot of emotional issues. Her TSH was marginally elevated when checked through her other physician's office. We'll recheck her TSH today. Issues of diet and exercise were reviewed. 3. Emotional health. Patient having increasing bouts of depression and anger. She is being seen by a counselor. I feel given her self reported worsening symptoms she has been seen by a psychiatrist and strongly consider medication. She's going to call her  counselor and asked for for up to a psychiatrist. She has any issues with this she knows to call me to help arrange. No acute risk of harm to herself or others. 4. Mammography 02/2012. Patient knows to schedule now. SBE monthly reviewed. 5. LGSIL. History of LEEP 2011 with CIN grade 2 with positive endocervical margin. Followup Pap smear 2012 was normal. Pap smear 2013 showed LGSIL with positive high-risk HPV. Colposcopy was inadequate with ECC negative. Pap smear done today with HPV. Triage based upon results. 6. Colonoscopy. Recommended scheduling screening colonoscopy this year and she agrees to do so. 7. Health maintenance. Has liver functions and CBCs done due to her rheumatoid arthritis and methotrexate. We'll check comprehensive metabolic panel lipid profile urinalysis along with her TSH T4.    Dara Lords MD, 11:14 AM 03/06/2013

## 2013-03-06 NOTE — Patient Instructions (Addendum)
Call to arrange follow up with psychiatrist. Call me if you have any issues arranging this. Schedule screening colonoscopy with [facility] gastroenterology. Followup in one year for annual exam.

## 2013-03-07 LAB — URINALYSIS W MICROSCOPIC + REFLEX CULTURE
Casts: NONE SEEN
Crystals: NONE SEEN
Glucose, UA: NEGATIVE mg/dL
Leukocytes, UA: NEGATIVE
Nitrite: NEGATIVE
Specific Gravity, Urine: 1.027 (ref 1.005–1.030)
pH: 6 (ref 5.0–8.0)

## 2013-03-08 ENCOUNTER — Encounter: Payer: Self-pay | Admitting: Gynecology

## 2013-03-26 ENCOUNTER — Other Ambulatory Visit: Payer: Self-pay | Admitting: *Deleted

## 2013-03-26 ENCOUNTER — Other Ambulatory Visit: Payer: Managed Care, Other (non HMO)

## 2013-03-26 DIAGNOSIS — Z13220 Encounter for screening for lipoid disorders: Secondary | ICD-10-CM

## 2013-03-26 DIAGNOSIS — M069 Rheumatoid arthritis, unspecified: Secondary | ICD-10-CM

## 2013-03-26 LAB — CBC WITH DIFFERENTIAL/PLATELET
Basophils Absolute: 0 10*3/uL (ref 0.0–0.1)
Basophils Relative: 1 % (ref 0–1)
Eosinophils Absolute: 0.1 10*3/uL (ref 0.0–0.7)
Lymphs Abs: 1.3 10*3/uL (ref 0.7–4.0)
MCH: 32.2 pg (ref 26.0–34.0)
Neutrophils Relative %: 62 % (ref 43–77)
Platelets: 214 10*3/uL (ref 150–400)
RBC: 4.25 MIL/uL (ref 3.87–5.11)
RDW: 13.4 % (ref 11.5–15.5)
WBC: 4.9 10*3/uL (ref 4.0–10.5)

## 2013-03-27 LAB — LIPID PANEL
LDL Cholesterol: 73 mg/dL (ref 0–99)
Total CHOL/HDL Ratio: 2.6 Ratio
Triglycerides: 61 mg/dL (ref <150)
VLDL: 12 mg/dL (ref 0–40)

## 2013-04-16 ENCOUNTER — Encounter: Payer: Self-pay | Admitting: Gynecology

## 2013-04-16 ENCOUNTER — Ambulatory Visit (INDEPENDENT_AMBULATORY_CARE_PROVIDER_SITE_OTHER): Payer: Managed Care, Other (non HMO) | Admitting: Gynecology

## 2013-04-16 DIAGNOSIS — R87810 Cervical high risk human papillomavirus (HPV) DNA test positive: Secondary | ICD-10-CM

## 2013-04-16 DIAGNOSIS — R6889 Other general symptoms and signs: Secondary | ICD-10-CM

## 2013-04-16 DIAGNOSIS — N882 Stricture and stenosis of cervix uteri: Secondary | ICD-10-CM

## 2013-04-16 DIAGNOSIS — IMO0002 Reserved for concepts with insufficient information to code with codable children: Secondary | ICD-10-CM

## 2013-04-16 NOTE — Patient Instructions (Signed)
Office will call you with biopsy results 

## 2013-04-16 NOTE — Progress Notes (Signed)
Patient ID: Tiffany Jefferson, female   DOB: 27-Apr-1961, 52 y.o.   MRN: [id number]  Patient presents with history of LEEP 2011 with CIN grade 2 with positive endocervical margin. Followup Pap smear 2012 was normal. Pap smear 2013 showed LGSIL with positive high-risk HPV. Colposcopy was inadequate with ECC negative. Most recent Pap smear showed LGSIL with few cells suggestive of a higher grade lesion and positive high-risk HPV. Patient presents for colposcopy.  Exam with Selena Batten assistant External BUS vagina normal. Cervix grossly normal. Uterus normal size midline mobile nontender. Adnexa without masses or tenderness  Colposcopy after acetic acid cleanse is inadequate. She does have an area of acetowhite change at 12 to 2:00. She does have some cervical stenosis and a paracervical block using 1% lidocaine total of 10 cc was placed before the biopsy. Single-tooth tenaculum anterior lip stabilization. Biopsy taken at 12 to 2:00. ECC performed. Patient tolerated well. 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 Reviewed the whole issue of HPV and her issue of immunosuppression. Patient will follow up the biopsy results. Possible followup options reviewed to include repeat LEEP. Ultimately hysterectomy discussed if persists to be a problem. She does understand the viral etiology and that this does not limit eliminate the virus from the vagina or vulva and the risks of vaginal or vulvar dysplasia also discussed.

## 2013-04-16 NOTE — Addendum Note (Signed)
Addended by: Dayna Barker on: 04/16/2013 03:32 PM   Modules accepted: Orders

## 2013-04-17 ENCOUNTER — Encounter: Payer: Self-pay | Admitting: Gynecology

## 2013-05-08 ENCOUNTER — Other Ambulatory Visit: Payer: Managed Care, Other (non HMO)

## 2013-05-08 DIAGNOSIS — M069 Rheumatoid arthritis, unspecified: Secondary | ICD-10-CM

## 2013-05-08 LAB — CBC WITH DIFFERENTIAL/PLATELET
Basophils Absolute: 0 10*3/uL (ref 0.0–0.1)
Basophils Relative: 1 % (ref 0–1)
Eosinophils Absolute: 0.1 10*3/uL (ref 0.0–0.7)
Hemoglobin: 12.9 g/dL (ref 12.0–15.0)
MCH: 31.9 pg (ref 26.0–34.0)
MCHC: 34.7 g/dL (ref 30.0–36.0)
Neutro Abs: 4 10*3/uL (ref 1.7–7.7)
Neutrophils Relative %: 64 % (ref 43–77)
Platelets: 241 10*3/uL (ref 150–400)
RDW: 13.2 % (ref 11.5–15.5)

## 2013-05-08 LAB — ALT: ALT: 19 U/L (ref 0–35)

## 2013-05-08 LAB — GAMMA GT: GGT: 12 U/L (ref 7–51)

## 2013-07-01 ENCOUNTER — Other Ambulatory Visit: Payer: Managed Care, Other (non HMO)

## 2013-07-03 ENCOUNTER — Other Ambulatory Visit: Payer: Managed Care, Other (non HMO)

## 2013-07-03 DIAGNOSIS — M069 Rheumatoid arthritis, unspecified: Secondary | ICD-10-CM

## 2013-07-03 LAB — CBC WITH DIFFERENTIAL/PLATELET
Basophils Absolute: 0 10*3/uL (ref 0.0–0.1)
Basophils Relative: 0 % (ref 0–1)
Eosinophils Relative: 2 % (ref 0–5)
HCT: 37.9 % (ref 36.0–46.0)
Lymphocytes Relative: 25 % (ref 12–46)
MCHC: 34.6 g/dL (ref 30.0–36.0)
Monocytes Absolute: 0.3 10*3/uL (ref 0.1–1.0)
Neutro Abs: 3.4 10*3/uL (ref 1.7–7.7)
Platelets: 243 10*3/uL (ref 150–400)
RDW: 13.3 % (ref 11.5–15.5)
WBC: 5.1 10*3/uL (ref 4.0–10.5)

## 2013-07-03 LAB — ALT: ALT: 11 U/L (ref 0–35)

## 2013-08-12 ENCOUNTER — Other Ambulatory Visit: Payer: Managed Care, Other (non HMO)

## 2013-08-12 ENCOUNTER — Other Ambulatory Visit: Payer: Self-pay | Admitting: *Deleted

## 2013-08-12 DIAGNOSIS — M069 Rheumatoid arthritis, unspecified: Secondary | ICD-10-CM

## 2013-08-12 LAB — CBC WITH DIFFERENTIAL/PLATELET
Lymphs Abs: 1.1 10*3/uL (ref 0.7–4.0)
MCH: 32.4 pg (ref 26.0–34.0)
MCV: 93.5 fL (ref 78.0–100.0)
Monocytes Absolute: 0.4 10*3/uL (ref 0.1–1.0)
Monocytes Relative: 8 % (ref 3–12)
Neutrophils Relative %: 69 % (ref 43–77)
Platelets: 228 10*3/uL (ref 150–400)
RDW: 13.5 % (ref 11.5–15.5)
WBC: 5.4 10*3/uL (ref 4.0–10.5)

## 2013-08-12 LAB — GAMMA GT: GGT: 12 U/L (ref 7–51)

## 2013-08-14 ENCOUNTER — Other Ambulatory Visit: Payer: Managed Care, Other (non HMO)

## 2013-08-29 ENCOUNTER — Other Ambulatory Visit: Payer: Self-pay

## 2013-09-23 ENCOUNTER — Other Ambulatory Visit: Payer: Managed Care, Other (non HMO)

## 2013-09-23 ENCOUNTER — Other Ambulatory Visit: Payer: Self-pay | Admitting: Gynecology

## 2013-09-23 LAB — CBC WITH DIFFERENTIAL/PLATELET
HCT: 38.8 % (ref 36.0–46.0)
Hemoglobin: 13.6 g/dL (ref 12.0–15.0)
Lymphocytes Relative: 34 % (ref 12–46)
Monocytes Absolute: 0.3 10*3/uL (ref 0.1–1.0)
Monocytes Relative: 7 % (ref 3–12)
Neutro Abs: 2.4 10*3/uL (ref 1.7–7.7)
Neutrophils Relative %: 57 % (ref 43–77)
RBC: 4.12 MIL/uL (ref 3.87–5.11)
WBC: 4.1 10*3/uL (ref 4.0–10.5)

## 2013-09-23 LAB — ALT: ALT: 16 U/L (ref 0–35)

## 2013-09-23 LAB — GAMMA GT: GGT: 15 U/L (ref 7–51)

## 2013-11-04 ENCOUNTER — Other Ambulatory Visit: Payer: Self-pay | Admitting: Gynecology

## 2013-11-04 ENCOUNTER — Other Ambulatory Visit: Payer: Managed Care, Other (non HMO)

## 2013-11-04 LAB — CBC WITH DIFFERENTIAL/PLATELET
BASOS ABS: 0.1 10*3/uL (ref 0.0–0.1)
BASOS PCT: 1 % (ref 0–1)
Eosinophils Absolute: 0.1 10*3/uL (ref 0.0–0.7)
Eosinophils Relative: 2 % (ref 0–5)
HCT: 38.8 % (ref 36.0–46.0)
Hemoglobin: 13.4 g/dL (ref 12.0–15.0)
Lymphocytes Relative: 27 % (ref 12–46)
Lymphs Abs: 1.4 10*3/uL (ref 0.7–4.0)
MCH: 32.4 pg (ref 26.0–34.0)
MCHC: 34.5 g/dL (ref 30.0–36.0)
MCV: 93.9 fL (ref 78.0–100.0)
Monocytes Absolute: 0.3 10*3/uL (ref 0.1–1.0)
Monocytes Relative: 6 % (ref 3–12)
NEUTROS PCT: 64 % (ref 43–77)
Neutro Abs: 3.5 10*3/uL (ref 1.7–7.7)
Platelets: 253 10*3/uL (ref 150–400)
RBC: 4.13 MIL/uL (ref 3.87–5.11)
RDW: 13.7 % (ref 11.5–15.5)
WBC: 5.3 10*3/uL (ref 4.0–10.5)

## 2013-11-04 LAB — GAMMA GT: GGT: 13 U/L (ref 7–51)

## 2013-11-04 LAB — ALT: ALT: 13 U/L (ref 0–35)

## 2013-12-16 ENCOUNTER — Other Ambulatory Visit: Payer: Managed Care, Other (non HMO)

## 2013-12-16 ENCOUNTER — Other Ambulatory Visit: Payer: Self-pay | Admitting: Gynecology

## 2013-12-16 LAB — CBC WITH DIFFERENTIAL/PLATELET
BASOS ABS: 0 10*3/uL (ref 0.0–0.1)
BASOS PCT: 1 % (ref 0–1)
Eosinophils Absolute: 0.1 10*3/uL (ref 0.0–0.7)
Eosinophils Relative: 2 % (ref 0–5)
HEMATOCRIT: 38.4 % (ref 36.0–46.0)
Hemoglobin: 13.2 g/dL (ref 12.0–15.0)
Lymphocytes Relative: 29 % (ref 12–46)
Lymphs Abs: 1.3 10*3/uL (ref 0.7–4.0)
MCH: 32.1 pg (ref 26.0–34.0)
MCHC: 34.4 g/dL (ref 30.0–36.0)
MCV: 93.4 fL (ref 78.0–100.0)
MONO ABS: 0.3 10*3/uL (ref 0.1–1.0)
Monocytes Relative: 7 % (ref 3–12)
NEUTROS PCT: 61 % (ref 43–77)
Neutro Abs: 2.7 10*3/uL (ref 1.7–7.7)
Platelets: 214 10*3/uL (ref 150–400)
RBC: 4.11 MIL/uL (ref 3.87–5.11)
RDW: 13.3 % (ref 11.5–15.5)
WBC: 4.5 10*3/uL (ref 4.0–10.5)

## 2013-12-16 LAB — GAMMA GT: GGT: 11 U/L (ref 7–51)

## 2013-12-16 LAB — ALT: ALT: 18 U/L (ref 0–35)

## 2014-01-27 ENCOUNTER — Other Ambulatory Visit: Payer: Self-pay | Admitting: Gynecology

## 2014-01-27 ENCOUNTER — Other Ambulatory Visit: Payer: Managed Care, Other (non HMO)

## 2014-01-27 LAB — CBC WITH DIFFERENTIAL/PLATELET
BASOS ABS: 0.1 10*3/uL (ref 0.0–0.1)
BASOS PCT: 1 % (ref 0–1)
EOS ABS: 0.1 10*3/uL (ref 0.0–0.7)
Eosinophils Relative: 2 % (ref 0–5)
HEMATOCRIT: 39.5 % (ref 36.0–46.0)
Hemoglobin: 13.5 g/dL (ref 12.0–15.0)
Lymphocytes Relative: 24 % (ref 12–46)
Lymphs Abs: 1.5 10*3/uL (ref 0.7–4.0)
MCH: 32.2 pg (ref 26.0–34.0)
MCHC: 34.2 g/dL (ref 30.0–36.0)
MCV: 94.3 fL (ref 78.0–100.0)
MONO ABS: 0.4 10*3/uL (ref 0.1–1.0)
Monocytes Relative: 6 % (ref 3–12)
NEUTROS ABS: 4.2 10*3/uL (ref 1.7–7.7)
Neutrophils Relative %: 67 % (ref 43–77)
Platelets: 258 10*3/uL (ref 150–400)
RBC: 4.19 MIL/uL (ref 3.87–5.11)
RDW: 12.7 % (ref 11.5–15.5)
WBC: 6.3 10*3/uL (ref 4.0–10.5)

## 2014-01-27 LAB — ALT: ALT: 12 U/L (ref 0–35)

## 2014-01-27 LAB — GAMMA GT: GGT: 12 U/L (ref 7–51)

## 2014-03-07 ENCOUNTER — Encounter: Payer: Self-pay | Admitting: Gynecology

## 2014-03-07 ENCOUNTER — Other Ambulatory Visit: Payer: Self-pay | Admitting: Gynecology

## 2014-03-07 ENCOUNTER — Other Ambulatory Visit (HOSPITAL_COMMUNITY)
Admission: RE | Admit: 2014-03-07 | Discharge: 2014-03-07 | Disposition: A | Discharge status: Home / Self Care | Payer: Managed Care, Other (non HMO) | Source: Ambulatory Visit | Attending: Gynecology | Admitting: Gynecology

## 2014-03-07 ENCOUNTER — Ambulatory Visit (INDEPENDENT_AMBULATORY_CARE_PROVIDER_SITE_OTHER): Payer: Managed Care, Other (non HMO) | Admitting: Gynecology

## 2014-03-07 VITALS — BP 118/74 | Ht 62.0 in | Wt 130.0 lb

## 2014-03-07 DIAGNOSIS — Z1151 Encounter for screening for human papillomavirus (HPV): Secondary | ICD-10-CM | POA: Insufficient documentation

## 2014-03-07 DIAGNOSIS — M069 Rheumatoid arthritis, unspecified: Secondary | ICD-10-CM

## 2014-03-07 DIAGNOSIS — Z01419 Encounter for gynecological examination (general) (routine) without abnormal findings: Secondary | ICD-10-CM

## 2014-03-07 DIAGNOSIS — N8111 Cystocele, midline: Secondary | ICD-10-CM

## 2014-03-07 DIAGNOSIS — Z124 Encounter for screening for malignant neoplasm of cervix: Secondary | ICD-10-CM | POA: Insufficient documentation

## 2014-03-07 DIAGNOSIS — N951 Menopausal and female climacteric states: Secondary | ICD-10-CM

## 2014-03-07 DIAGNOSIS — R87810 Cervical high risk human papillomavirus (HPV) DNA test positive: Secondary | ICD-10-CM | POA: Insufficient documentation

## 2014-03-07 DIAGNOSIS — N814 Uterovaginal prolapse, unspecified: Secondary | ICD-10-CM

## 2014-03-07 LAB — CBC WITH DIFFERENTIAL/PLATELET
Basophils Absolute: 0.1 10*3/uL (ref 0.0–0.1)
Basophils Relative: 1 % (ref 0–1)
Eosinophils Absolute: 0.1 10*3/uL (ref 0.0–0.7)
Eosinophils Relative: 1 % (ref 0–5)
HCT: 36.6 % (ref 36.0–46.0)
Hemoglobin: 12.7 g/dL (ref 12.0–15.0)
LYMPHS PCT: 24 % (ref 12–46)
Lymphs Abs: 1.4 10*3/uL (ref 0.7–4.0)
MCH: 31.7 pg (ref 26.0–34.0)
MCHC: 34.7 g/dL (ref 30.0–36.0)
MCV: 91.3 fL (ref 78.0–100.0)
Monocytes Absolute: 0.5 10*3/uL (ref 0.1–1.0)
Monocytes Relative: 8 % (ref 3–12)
Neutro Abs: 4 10*3/uL (ref 1.7–7.7)
Neutrophils Relative %: 66 % (ref 43–77)
PLATELETS: 254 10*3/uL (ref 150–400)
RBC: 4.01 MIL/uL (ref 3.87–5.11)
RDW: 13.8 % (ref 11.5–15.5)
WBC: 6 10*3/uL (ref 4.0–10.5)

## 2014-03-07 NOTE — Progress Notes (Signed)
Tiffany Jefferson 1961/08/14 [id number]        53 y.o.  G2P2 for annual exam.  Several issues noted below.  Past medical history,surgical history, problem list, medications, allergies, family history and social history were all reviewed and documented as reviewed in the EPIC chart.  ROS:  12 system ROS performed with pertinent positives and negatives included in the history, assessment and plan.  Included Systems: General, HEENT, Neck, Cardiovascular, Pulmonary, Gastrointestinal, Genitourinary, Musculoskeletal, Dermatologic, Endocrine, Hematological, Neurologic, Psychiatric Additional significant findings :  Emotional issues noted below.   Exam: Kim assistant Filed Vitals:   03/07/14 1107  BP: 118/74  Height: 5\' 2"  (1.575 m)  Weight: 130 lb (58.968 kg)   General appearance:  Normal affect, orientation and appearance. Skin: Grossly normal HEENT: Without gross lesions.  No cervical or supraclavicular adenopathy. Thyroid normal.  Lungs:  Clear without wheezing, rales or rhonchi Cardiac: RR, without RMG Abdominal:  Soft, nontender, without masses, guarding, rebound, organomegaly or hernia Breasts:  Examined lying and sitting without masses, retractions, discharge or axillary adenopathy. Pelvic:  Ext/BUS/vagina with mild atrophic changes. Second-degree cystocele with straining. First degree uterine prolapse.  Cervix grossly normal. Pap/HPV done  Uterus anteverted, normal size, shape and contour, midline and mobile nontender   Adnexa  Without masses or tenderness    Anus and perineum  Normal   Rectovaginal  Normal sphincter tone without palpated masses or tenderness.    Assessment/Plan:  53 y.o. G2P2 female for annual exam.   1. Postmenopausal/mild atrophic genital changes. Without significant symptoms of hot flushes, night sweats, vaginal dryness. Is not sexually active. No vaginal bleeding. She asked about HRT. I reviewed the issues of HRT to include the WHI study with increased  risk of stroke heart attack DVT and breast cancer. The ACOG and NAMS statements for lowest dose for shortest period of time. Not to be used prophylactically but to specifically treat symptoms. At this point will not initiate if she is not clearly having any symptoms attributable to lack of estrogen. Patient knows to report any vaginal bleeding. 2. Cystocele/uterine prolapse. Patient does have some incontinence with laughing. Options for management include observation, Kegel exercises, pessary and surgery to include TVH anterior repair possible sling BSO reviewed. Patient is not interested in doing anything at this time but will monitor. 3. 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 Colposcopy with biopsy 03/2013 showed LGSIL with negative ECC. Does have history of LEEP 2011 with positive endocervical margin with CIN-2. Pap/HPV done today. Triage based upon results. 4. Emotional swings. Separated from husband for 8 years having issues with moving forward with divorce. Strongly recommended counseling and attorney input. Patient has been starting to move towards this I encouraged her to talk to professionals that deal with separation/divorce. 5. Mammography 02/2012. Strongly recommended scheduling screening mammography. Benefits of early detection. SBE monthly reviewed. 6. Colonoscopy never. Recommended baseline colonoscopy as she is over the age of 58. Names and numbers provided. 7. DEXA. Recommended baseline DEXA now that she is being followed for rheumatoid arthritis on treatment. Increased calcium vitamin D reviewed. 8. Health maintenance. Had normal lipid profile comprehensive metabolic panel last year. Recently had lipid and glucose checked at a health fair which were normal. Does get LFTs and CBCs due to her with her arthritis treatments which are normal. Followup for Pap smear results otherwise annually.   Note: This document was prepared with digital dictation and possible smart phrase technology.  Any transcriptional errors that result from this process are unintentional.  Dara Lords MD, 12:08 PM 03/07/2014

## 2014-03-07 NOTE — Patient Instructions (Addendum)
Followup for Pap smear results.  Schedule colonoscopy with [facility] gastroenterology at [phone] or [facility] gastroenterology at [phone]  Call to Schedule your mammogram  Facilities in [place]: 1)  [facility] [place], [place] Phone: [phone] 2)  [facility], [phone] Phone: [phone] 3)  Dr. Yolanda Bonine at [facility] [address] Phone: [phone]     Mammogram A mammogram is an X-ray test to find changes in a woman's breast. You should get a mammogram if:  You are 56 years of age or older  You have risk factors.   Your doctor recommends that you have one.  BEFORE THE TEST  Do not schedule the test the week before your period, especially if your breasts are sore during this time.  On the day of your mammogram:  Wash your breasts and armpits well. After washing, do not put on any deodorant or talcum powder on until after your test.   Eat and drink as you usually do.   Take your medicines as usual.   If you are diabetic and take insulin, make sure you:   Eat before coming for your test.   Take your insulin as usual.   If you cannot keep your appointment, call before the appointment to cancel. Schedule another appointment.  TEST  You will need to undress from the waist up. You will put on a hospital gown.   Your breast will be put on the mammogram machine, and it will press firmly on your breast with a piece of plastic called a compression paddle. This will make your breast flatter so that the machine can X-ray all parts of your breast.   Both breasts will be X-rayed. Each breast will be X-rayed from above and from the side. An X-ray might need to be taken again if the picture is not good enough.   The mammogram will last about 15 to 30 minutes.  AFTER THE TEST Finding out the results of your test Ask when your test results will be ready. Make  sure you get your test results.  Document Released: 01/06/2009 Document Revised: 09/29/2011 Document Reviewed: 01/06/2009 [facility] Patient Information 2012 [place], [place].

## 2014-03-08 LAB — URINALYSIS W MICROSCOPIC + REFLEX CULTURE
BACTERIA UA: NONE SEEN
BILIRUBIN URINE: NEGATIVE
Casts: NONE SEEN
Crystals: NONE SEEN
GLUCOSE, UA: NEGATIVE mg/dL
HGB URINE DIPSTICK: NEGATIVE
KETONES UR: NEGATIVE mg/dL
Nitrite: NEGATIVE
PH: 5 (ref 5.0–8.0)
Protein, ur: NEGATIVE mg/dL
Specific Gravity, Urine: 1.022 (ref 1.005–1.030)
Urobilinogen, UA: 0.2 mg/dL (ref 0.0–1.0)

## 2014-03-08 LAB — GAMMA GT: GGT: 10 U/L (ref 7–51)

## 2014-03-08 LAB — ALT: ALT: 11 U/L (ref 0–35)

## 2014-03-09 LAB — URINE CULTURE
COLONY COUNT: NO GROWTH
ORGANISM ID, BACTERIA: NO GROWTH

## 2014-03-16 ENCOUNTER — Encounter: Payer: Self-pay | Admitting: Gynecology

## 2014-03-18 ENCOUNTER — Encounter: Payer: Managed Care, Other (non HMO) | Admitting: Gynecology

## 2014-06-17 ENCOUNTER — Ambulatory Visit: Payer: Managed Care, Other (non HMO) | Admitting: Gynecology

## 2014-08-11 ENCOUNTER — Telehealth: Payer: Self-pay | Admitting: *Deleted

## 2014-08-11 DIAGNOSIS — M069 Rheumatoid arthritis, unspecified: Secondary | ICD-10-CM

## 2014-08-11 NOTE — Telephone Encounter (Signed)
Orders placed, pt will be in tomorrow to have labs drawn.

## 2014-08-11 NOTE — Telephone Encounter (Signed)
yes

## 2014-08-11 NOTE — Telephone Encounter (Signed)
Pt MD in [place] requested pt have a AST,ALT,CBC, ALBUMIN LEVEL drawn and faxed to his office once labs return. Pt has Rheumatoid arthritis.  Okay to placed orders?

## 2014-08-12 ENCOUNTER — Other Ambulatory Visit: Payer: Self-pay | Admitting: Gynecology

## 2014-08-12 ENCOUNTER — Other Ambulatory Visit: Payer: Managed Care, Other (non HMO)

## 2014-08-12 LAB — CBC WITH DIFFERENTIAL/PLATELET
BASOS PCT: 1 % (ref 0–1)
Basophils Absolute: 0.1 10*3/uL (ref 0.0–0.1)
EOS PCT: 1 % (ref 0–5)
Eosinophils Absolute: 0.1 10*3/uL (ref 0.0–0.7)
HEMATOCRIT: 38.4 % (ref 36.0–46.0)
HEMOGLOBIN: 13.1 g/dL (ref 12.0–15.0)
Lymphocytes Relative: 30 % (ref 12–46)
Lymphs Abs: 1.7 10*3/uL (ref 0.7–4.0)
MCH: 32.1 pg (ref 26.0–34.0)
MCHC: 34.1 g/dL (ref 30.0–36.0)
MCV: 94.1 fL (ref 78.0–100.0)
MONO ABS: 0.5 10*3/uL (ref 0.1–1.0)
MONOS PCT: 8 % (ref 3–12)
NEUTROS ABS: 3.5 10*3/uL (ref 1.7–7.7)
Neutrophils Relative %: 60 % (ref 43–77)
Platelets: 227 10*3/uL (ref 150–400)
RBC: 4.08 MIL/uL (ref 3.87–5.11)
RDW: 13.7 % (ref 11.5–15.5)
WBC: 5.8 10*3/uL (ref 4.0–10.5)

## 2014-08-12 LAB — CREATININE, SERUM: Creat: 0.8 mg/dL (ref 0.50–1.10)

## 2014-08-12 LAB — ALBUMIN: Albumin: 4.1 g/dL (ref 3.5–5.2)

## 2014-08-12 LAB — ALT: ALT: 20 U/L (ref 0–35)

## 2014-08-12 LAB — AST: AST: 19 U/L (ref 0–37)

## 2014-08-12 NOTE — Addendum Note (Signed)
Addended by: Aura Camps on: 08/12/2014 01:47 PM   Modules accepted: Orders

## 2014-08-25 ENCOUNTER — Encounter: Payer: Self-pay | Admitting: Gynecology

## 2014-11-10 ENCOUNTER — Other Ambulatory Visit: Payer: Managed Care, Other (non HMO)

## 2014-11-10 ENCOUNTER — Telehealth: Payer: Self-pay | Admitting: *Deleted

## 2014-11-10 DIAGNOSIS — M069 Rheumatoid arthritis, unspecified: Secondary | ICD-10-CM

## 2014-11-10 LAB — CBC
HCT: 39 % (ref 36.0–46.0)
HEMOGLOBIN: 13.3 g/dL (ref 12.0–15.0)
MCH: 32.3 pg (ref 26.0–34.0)
MCHC: 34.1 g/dL (ref 30.0–36.0)
MCV: 94.7 fL (ref 78.0–100.0)
MPV: 10.9 fL (ref 8.6–12.4)
Platelets: 268 10*3/uL (ref 150–400)
RBC: 4.12 MIL/uL (ref 3.87–5.11)
RDW: 13.2 % (ref 11.5–15.5)
WBC: 7.4 10*3/uL (ref 4.0–10.5)

## 2014-11-10 LAB — ALT: ALT: 27 U/L (ref 0–35)

## 2014-11-10 LAB — CREATININE, SERUM: Creat: 0.76 mg/dL (ref 0.50–1.10)

## 2014-11-10 LAB — AST: AST: 25 U/L (ref 0–37)

## 2014-11-10 LAB — ALBUMIN: Albumin: 4 g/dL (ref 3.5–5.2)

## 2014-11-10 NOTE — Telephone Encounter (Signed)
Pt came to have labs drawn for Rheumatoid arthritis  MD in [place] per TF on 08/11/14 okay to place labs this was done today but creatine was not in system, order placed. Pt will come back in 3 month to have recheck orders will be placed for then as well.

## 2014-11-19 ENCOUNTER — Other Ambulatory Visit: Payer: Managed Care, Other (non HMO)

## 2015-02-02 ENCOUNTER — Other Ambulatory Visit: Payer: Managed Care, Other (non HMO)

## 2015-02-06 ENCOUNTER — Other Ambulatory Visit: Payer: Managed Care, Other (non HMO)

## 2015-02-06 DIAGNOSIS — M069 Rheumatoid arthritis, unspecified: Secondary | ICD-10-CM

## 2015-02-06 LAB — CBC
HCT: 39.3 % (ref 36.0–46.0)
HEMOGLOBIN: 13.3 g/dL (ref 12.0–15.0)
MCH: 32.8 pg (ref 26.0–34.0)
MCHC: 33.8 g/dL (ref 30.0–36.0)
MCV: 97 fL (ref 78.0–100.0)
MPV: 11.6 fL (ref 8.6–12.4)
PLATELETS: 293 10*3/uL (ref 150–400)
RBC: 4.05 MIL/uL (ref 3.87–5.11)
RDW: 15 % (ref 11.5–15.5)
WBC: 7.5 10*3/uL (ref 4.0–10.5)

## 2015-02-10 LAB — ALBUMIN: Albumin: 4.3 g/dL (ref 3.5–5.2)

## 2015-02-10 LAB — AST: AST: 15 U/L (ref 0–37)

## 2015-02-10 LAB — CREATININE, SERUM: Creat: 0.75 mg/dL (ref 0.50–1.10)

## 2015-02-10 LAB — ALT: ALT: 13 U/L (ref 0–35)

## 2015-03-13 ENCOUNTER — Encounter: Payer: Managed Care, Other (non HMO) | Admitting: Gynecology

## 2015-07-17 ENCOUNTER — Ambulatory Visit (INDEPENDENT_AMBULATORY_CARE_PROVIDER_SITE_OTHER): Payer: Managed Care, Other (non HMO) | Admitting: Gynecology

## 2015-07-17 ENCOUNTER — Other Ambulatory Visit: Payer: Self-pay

## 2015-07-17 ENCOUNTER — Encounter: Payer: Self-pay | Admitting: Gynecology

## 2015-07-17 ENCOUNTER — Other Ambulatory Visit (HOSPITAL_COMMUNITY)
Admission: RE | Admit: 2015-07-17 | Discharge: 2015-07-17 | Disposition: A | Discharge status: Home / Self Care | Payer: Managed Care, Other (non HMO) | Source: Ambulatory Visit | Attending: Gynecology | Admitting: Gynecology

## 2015-07-17 VITALS — BP 130/80 | Ht 63.0 in | Wt 133.0 lb

## 2015-07-17 DIAGNOSIS — Z1231 Encounter for screening mammogram for malignant neoplasm of breast: Secondary | ICD-10-CM

## 2015-07-17 DIAGNOSIS — Z01411 Encounter for gynecological examination (general) (routine) with abnormal findings: Secondary | ICD-10-CM | POA: Diagnosis present

## 2015-07-17 DIAGNOSIS — IMO0002 Reserved for concepts with insufficient information to code with codable children: Secondary | ICD-10-CM

## 2015-07-17 DIAGNOSIS — N814 Uterovaginal prolapse, unspecified: Secondary | ICD-10-CM | POA: Diagnosis not present

## 2015-07-17 DIAGNOSIS — N8111 Cystocele, midline: Secondary | ICD-10-CM | POA: Diagnosis not present

## 2015-07-17 DIAGNOSIS — R896 Abnormal cytological findings in specimens from other organs, systems and tissues: Secondary | ICD-10-CM

## 2015-07-17 DIAGNOSIS — Z01419 Encounter for gynecological examination (general) (routine) without abnormal findings: Secondary | ICD-10-CM

## 2015-07-17 DIAGNOSIS — Z1151 Encounter for screening for human papillomavirus (HPV): Secondary | ICD-10-CM | POA: Insufficient documentation

## 2015-07-17 NOTE — Addendum Note (Signed)
Addended by: Dayna Barker on: 07/17/2015 04:25 PM   Modules accepted: Orders

## 2015-07-17 NOTE — Patient Instructions (Signed)
Call to Schedule your mammogram  Facilities in [place]: 1)  [facility], [place] Phone: [phone]  [facility]. [address] [place] Phone: [phone]  Dr. Isaiah Blakes at [facility] [address] Phone: [phone]     Mammogram A mammogram is an X-ray test to find changes in a woman's breast. You should get a mammogram if:  You are 40 years of age or older  You have risk factors.   Your doctor recommends that you have one.  BEFORE THE TEST  Do not schedule the test the week before your period, especially if your breasts are sore during this time.  On the day of your mammogram:  Wash your breasts and armpits well. After washing, do not put on any deodorant or talcum powder on until after your test.   Eat and drink as you usually do.   Take your medicines as usual.   If you are diabetic and take insulin, make sure you:   Eat before coming for your test.   Take your insulin as usual.   If you cannot keep your appointment, call before the appointment to cancel. Schedule another appointment.  TEST  You will need to undress from the waist up. You will put on a hospital gown.   Your breast will be put on the mammogram machine, and it will press firmly on your breast with a piece of plastic called a compression paddle. This will make your breast flatter so that the machine can X-ray all parts of your breast.   Both breasts will be X-rayed. Each breast will be X-rayed from above and from the side. An X-ray might need to be taken again if the picture is not good enough.   The mammogram will last about 15 to 30 minutes.  AFTER THE TEST Finding out the results of your test Ask when your test results will be ready. Make sure you get your test results.  Document Released: 01/06/2009 Document Revised: 09/29/2011 Document Reviewed: 01/06/2009 [facility] Patient  Information 2012 [place].  Schedule your colonoscopy with either:  [facility]   Address: [address], [place], [place] [address]  [phone]    or  [facility]  Address: [address], [place], [place] [address]  [phone]      You may obtain a copy of any labs that were done today by logging onto MyChart as outlined in the instructions provided with your AVS (after visit summary). The office will not call with normal lab results but certainly if there are any significant abnormalities then we will contact you.   Health Maintenance Adopting a healthy lifestyle and getting preventive care can go a long way to promote health and wellness. Talk with your health care provider about what schedule of regular examinations is right for you. This is a good chance for you to check in with your provider about disease prevention and staying healthy. In between checkups, there are plenty of things you can do on your own. Experts have done a lot of research about which lifestyle changes and preventive measures are most likely to keep you healthy. Ask your health care provider for more information. WEIGHT AND DIET  Eat a healthy diet  Be sure to include plenty of vegetables, fruits, low-fat dairy products, and lean protein.  Do not eat a lot of foods high in solid fats, added  sugars, or salt.  Get regular exercise. This is one of the most important things you can do for your health.  Most adults should exercise for at least 150 minutes each week. The exercise should increase your heart rate and make you sweat (moderate-intensity exercise).  Most adults should also do strengthening exercises at least twice a week. This is in addition to the moderate-intensity exercise.  Maintain a healthy weight  Body mass index (BMI) is a measurement that can be used to identify possible weight problems. It estimates body fat based on height and weight. Your  health care provider can help determine your BMI and help you achieve or maintain a healthy weight.  For females 36 years of age and older:   A BMI below 18.5 is considered underweight.  A BMI of 18.5 to 24.9 is normal.  A BMI of 25 to 29.9 is considered overweight.  A BMI of 30 and above is considered obese.  Watch levels of cholesterol and blood lipids  You should start having your blood tested for lipids and cholesterol at 54 years of age, then have this test every 5 years.  You may need to have your cholesterol levels checked more often if:  Your lipid or cholesterol levels are high.  You are older than 54 years of age.  You are at high risk for heart disease.  CANCER SCREENING   Lung Cancer  Lung cancer screening is recommended for adults 46-31 years old who are at high risk for lung cancer because of a history of smoking.  A yearly low-dose CT scan of the lungs is recommended for people who:  Currently smoke.  Have quit within the past 15 years.  Have at least a 30-pack-year history of smoking. A pack year is smoking an average of one pack of cigarettes a day for 1 year.  Yearly screening should continue until it has been 15 years since you quit.  Yearly screening should stop if you develop a health problem that would prevent you from having lung cancer treatment.  Breast Cancer  Practice breast self-awareness. This means understanding how your breasts normally appear and feel.  It also means doing regular breast self-exams. Let your health care provider know about any changes, no matter how small.  If you are in your 20s or 30s, you should have a clinical breast exam (CBE) by a health care provider every 1-3 years as part of a regular health exam.  If you are 54 or older, have a CBE every year. Also consider having a breast X-ray (mammogram) every year.  If you have a family history of breast cancer, talk to your health care provider about genetic  screening.  If you are at high risk for breast cancer, talk to your health care provider about having an MRI and a mammogram every year.  Breast cancer gene (BRCA) assessment is recommended for women who have family members with BRCA-related cancers. BRCA-related cancers include:  Breast.  Ovarian.  Tubal.  Peritoneal cancers.  Results of the assessment will determine the need for genetic counseling and BRCA1 and BRCA2 testing. Cervical Cancer Routine pelvic examinations to screen for cervical cancer are no longer recommended for nonpregnant women who are considered low risk for cancer of the pelvic organs (ovaries, uterus, and vagina) and who do not have symptoms. A pelvic examination may be necessary if you have symptoms including those associated with pelvic infections. Ask your health care provider if a screening pelvic exam is right  for you.   The Pap test is the screening test for cervical cancer for women who are considered at risk.  If you had a hysterectomy for a problem that was not cancer or a condition that could lead to cancer, then you no longer need Pap tests.  If you are older than 65 years, and you have had normal Pap tests for the past 10 years, you no longer need to have Pap tests.  If you have had past treatment for cervical cancer or a condition that could lead to cancer, you need Pap tests and screening for cancer for at least 20 years after your treatment.  If you no longer get a Pap test, assess your risk factors if they change (such as having a new sexual partner). This can affect whether you should start being screened again.  Some women have medical problems that increase their chance of getting cervical cancer. If this is the case for you, your health care provider may recommend more frequent screening and Pap tests.  The human papillomavirus (HPV) test is another test that may be used for cervical cancer screening. The HPV test looks for the virus that can  cause cell changes in the cervix. The cells collected during the Pap test can be tested for HPV.  The HPV test can be used to screen women 82 years of age and older. Getting tested for HPV can extend the interval between normal Pap tests from three to five years.  An HPV test also should be used to screen women of any age who have unclear Pap test results.  After 54 years of age, women should have HPV testing as often as Pap tests.  Colorectal Cancer  This type of cancer can be detected and often prevented.  Routine colorectal cancer screening usually begins at 54 years of age and continues through 53 years of age.  Your health care provider may recommend screening at an earlier age if you have risk factors for colon cancer.  Your health care provider may also recommend using home test kits to check for hidden blood in the stool.  A small camera at the end of a tube can be used to examine your colon directly (sigmoidoscopy or colonoscopy). This is done to check for the earliest forms of colorectal cancer.  Routine screening usually begins at age 53.  Direct examination of the colon should be repeated every 5-10 years through 54 years of age. However, you may need to be screened more often if early forms of precancerous polyps or small growths are found. Skin Cancer  Check your skin from head to toe regularly.  Tell your health care provider about any new moles or changes in moles, especially if there is a change in a mole's shape or color.  Also tell your health care provider if you have a mole that is larger than the size of a pencil eraser.  Always use sunscreen. Apply sunscreen liberally and repeatedly throughout the day.  Protect yourself by wearing long sleeves, pants, a wide-brimmed hat, and sunglasses whenever you are outside. HEART DISEASE, DIABETES, AND HIGH BLOOD PRESSURE   Have your blood pressure checked at least every 1-2 years. High blood pressure causes heart  disease and increases the risk of stroke.  If you are between 57 years and 71 years old, ask your health care provider if you should take aspirin to prevent strokes.  Have regular diabetes screenings. This involves taking a blood sample to check your  fasting blood sugar level.  If you are at a normal weight and have a low risk for diabetes, have this test once every three years after 54 years of age.  If you are overweight and have a high risk for diabetes, consider being tested at a younger age or more often. PREVENTING INFECTION  Hepatitis B  If you have a higher risk for hepatitis B, you should be screened for this virus. You are considered at high risk for hepatitis B if:  You were born in a country where hepatitis B is common. Ask your health care provider which countries are considered high risk.  Your parents were born in a high-risk country, and you have not been immunized against hepatitis B (hepatitis B vaccine).  You have HIV or AIDS.  You use needles to inject street drugs.  You live with someone who has hepatitis B.  You have had sex with someone who has hepatitis B.  You get hemodialysis treatment.  You take certain medicines for conditions, including cancer, organ transplantation, and autoimmune conditions. Hepatitis C  Blood testing is recommended for:  Everyone born from [birth year] through 1965.  Anyone with known risk factors for hepatitis C. Sexually transmitted infections (STIs)  You should be screened for sexually transmitted infections (STIs) including gonorrhea and chlamydia if:  You are sexually active and are younger than 54 years of age.  You are older than 54 years of age and your health care provider tells you that you are at risk for this type of infection.  Your sexual activity has changed since you were last screened and you are at an increased risk for chlamydia or gonorrhea. Ask your health care provider if you are at risk.  If you do not have  HIV, but are at risk, it may be recommended that you take a prescription medicine daily to prevent HIV infection. This is called pre-exposure prophylaxis (PrEP). You are considered at risk if:  You are sexually active and do not regularly use condoms or know the HIV status of your partner(s).  You take drugs by injection.  You are sexually active with a partner who has HIV. Talk with your health care provider about whether you are at high risk of being infected with HIV. If you choose to begin PrEP, you should first be tested for HIV. You should then be tested every 3 months for as long as you are taking PrEP.  PREGNANCY   If you are premenopausal and you may become pregnant, ask your health care provider about preconception counseling.  If you may become pregnant, take 400 to 800 micrograms (mcg) of folic acid every day.  If you want to prevent pregnancy, talk to your health care provider about birth control (contraception). OSTEOPOROSIS AND MENOPAUSE   Osteoporosis is a disease in which the bones lose minerals and strength with aging. This can result in serious bone fractures. Your risk for osteoporosis can be identified using a bone density scan.  If you are 36 years of age or older, or if you are at risk for osteoporosis and fractures, ask your health care provider if you should be screened.  Ask your health care provider whether you should take a calcium or vitamin D supplement to lower your risk for osteoporosis.  Menopause may have certain physical symptoms and risks.  Hormone replacement therapy may reduce some of these symptoms and risks. Talk to your health care provider about whether hormone replacement therapy is right for you.  HOME CARE INSTRUCTIONS   Schedule regular health, dental, and eye exams.  Stay current with your immunizations.   Do not use any tobacco products including cigarettes, chewing tobacco, or electronic cigarettes.  If you are pregnant, do not  drink alcohol.  If you are breastfeeding, limit how much and how often you drink alcohol.  Limit alcohol intake to no more than 1 drink per day for nonpregnant women. One drink equals 12 ounces of beer, 5 ounces of wine, or 1 ounces of hard liquor.  Do not use street drugs.  Do not share needles.  Ask your health care provider for help if you need support or information about quitting drugs.  Tell your health care provider if you often feel depressed.  Tell your health care provider if you have ever been abused or do not feel safe at home. Document Released: 04/25/2011 Document Revised: 02/24/2014 Document Reviewed: 09/11/2013 [facility] Patient Information 2015 [place], [place]. This information is not intended to replace advice given to you by your health care provider. Make sure you discuss any questions you have with your health care provider.

## 2015-07-17 NOTE — Progress Notes (Signed)
Tiffany Jefferson [birth year] [id number]        54 y.o.  G2P2 for annual exam.  Several issues noted below.  Past medical history,surgical history, problem list, medications, allergies, family history and social history were all reviewed and documented as reviewed in the EPIC chart.  ROS:  Performed with pertinent positives and negatives included in the history, assessment and plan.   Additional significant findings :  none   Exam: [facility] person Vitals:   07/17/15 1529  BP: 130/80  Height: 5\' 3"  (1.6 m)  Weight: 133 lb (60.328 kg)   General appearance:  Normal affect, orientation and appearance. Skin: Grossly normal HEENT: Without gross lesions.  No cervical or supraclavicular adenopathy. Thyroid normal.  Lungs:  Clear without wheezing, rales or rhonchi Cardiac: RR, without RMG Abdominal:  Soft, nontender, without masses, guarding, rebound, organomegaly or hernia Breasts:  Examined lying and sitting without masses, retractions, discharge or axillary adenopathy. Pelvic:  Ext/BUS/vagina normal with mild atrophic changes  Cervix normal with mild atrophic changes. Pap/HPV  Uterus anteverted, normal size, shape and contour, midline and mobile nontender   Adnexa  Without masses or tenderness    Anus and perineum  Normal   Rectovaginal  Normal sphincter tone without palpated masses or tenderness.    Assessment/Plan:  54 y.o. G2P2 female for annual exam.   1. 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 Colposcopy with biopsy 03/2013 showed LGSIL with negative ECC. 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 Pap smear  2015 LGSIL with positive high-risk HPV. Was recommended to schedule colposcopy but she never followed up for this. Patient admits that she knew she was supposed to but just didn't want to do it. Pap smear/HPV today. I reviewed the issues as to whether we should see with colposcopy regardless or await Pap smear results.   If negative Pap smear again should we do colposcopy or repeat next year. The issues of a missing occult disease discussed. At this point plans are colposcopy if Pap smear abnormal and if normal/negative HPV then repeat next year. Patient knows importance of follow up in the issues of progression to cancer particularly in patients on immunosuppression noting her methotrexate use for rheumatoid arthritis. 2. Postmenopausal/mild atrophic changes. Without significant hot flushes and night sweats vaginal dryness or any vaginal bleeding. Report any vaginal bleeding. 3. Cystocele uterine prolapse. Patient does have some heaviness when standing for long periods of time. Not overly bothersome to the patient. Notes before having some occasional stress symptoms but these seem to have resolved and she's not having any issues as far as urinary incontinence. Patient prefers to monitor at this point. I did discuss options to include pessary surgery. 4. Mammography 02/2012. Patient knows she is way overdue and agrees to schedule. SBE monthly reviewed. 5. Colonoscopy never. Patient knows that she should schedule a screening colonoscopy and acknowledges my recommendation. 6. Health maintenance. Patient requests baseline labs. CBC comprehensive metabolic panel lipid profile urinalysis TSH vitamin D ordered.  Follow up for Pap smear results.   03/2012 MD, 3:57 PM 07/17/2015

## 2015-07-18 LAB — URINALYSIS W MICROSCOPIC + REFLEX CULTURE
Bacteria, UA: NONE SEEN [HPF]
Bilirubin Urine: NEGATIVE
CASTS: NONE SEEN [LPF]
Crystals: NONE SEEN [HPF]
GLUCOSE, UA: NEGATIVE
HGB URINE DIPSTICK: NEGATIVE
Ketones, ur: NEGATIVE
NITRITE: NEGATIVE
Protein, ur: NEGATIVE
RBC / HPF: NONE SEEN RBC/HPF (ref <=2)
SPECIFIC GRAVITY, URINE: 1.009 (ref 1.001–1.035)
YEAST: NONE SEEN [HPF]
pH: 5.5 (ref 5.0–8.0)

## 2015-07-19 LAB — URINE CULTURE

## 2015-07-20 ENCOUNTER — Other Ambulatory Visit: Payer: Self-pay | Admitting: Gynecology

## 2015-07-20 ENCOUNTER — Other Ambulatory Visit: Payer: Managed Care, Other (non HMO)

## 2015-07-20 LAB — COMPREHENSIVE METABOLIC PANEL
ALT: 16 U/L (ref 6–29)
AST: 15 U/L (ref 10–35)
Albumin: 4 g/dL (ref 3.6–5.1)
Alkaline Phosphatase: 65 U/L (ref 33–130)
BUN: 12 mg/dL (ref 7–25)
CHLORIDE: 104 mmol/L (ref 98–110)
CO2: 27 mmol/L (ref 20–31)
Calcium: 9 mg/dL (ref 8.6–10.4)
Creat: 0.81 mg/dL (ref 0.50–1.05)
GLUCOSE: 92 mg/dL (ref 65–99)
POTASSIUM: 3.8 mmol/L (ref 3.5–5.3)
Sodium: 141 mmol/L (ref 135–146)
Total Bilirubin: 0.6 mg/dL (ref 0.2–1.2)
Total Protein: 6.4 g/dL (ref 6.1–8.1)

## 2015-07-20 LAB — LIPID PANEL
CHOL/HDL RATIO: 2.9 ratio (ref <=5.0)
Cholesterol: 141 mg/dL (ref 125–200)
HDL: 49 mg/dL (ref >=46)
LDL Cholesterol: 80 mg/dL (ref <130)
Triglycerides: 58 mg/dL (ref <150)
VLDL: 12 mg/dL (ref <30)

## 2015-07-20 LAB — AST: AST: 15 U/L (ref 10–35)

## 2015-07-20 LAB — ALBUMIN: Albumin: 3.9 g/dL (ref 3.6–5.1)

## 2015-07-20 LAB — ALT: ALT: 15 U/L (ref 6–29)

## 2015-07-20 LAB — GAMMA GT: GGT: 11 U/L (ref 7–51)

## 2015-07-20 LAB — CBC WITH DIFFERENTIAL/PLATELET
BASOS ABS: 0 10*3/uL (ref 0.0–0.1)
Basophils Relative: 1 % (ref 0–1)
EOS PCT: 1 % (ref 0–5)
Eosinophils Absolute: 0 10*3/uL (ref 0.0–0.7)
HCT: 37.3 % (ref 36.0–46.0)
Hemoglobin: 13.1 g/dL (ref 12.0–15.0)
LYMPHS ABS: 1.1 10*3/uL (ref 0.7–4.0)
Lymphocytes Relative: 30 % (ref 12–46)
MCH: 33.9 pg (ref 26.0–34.0)
MCHC: 35.1 g/dL (ref 30.0–36.0)
MCV: 96.4 fL (ref 78.0–100.0)
MONOS PCT: 8 % (ref 3–12)
MPV: 11.2 fL (ref 8.6–12.4)
Monocytes Absolute: 0.3 10*3/uL (ref 0.1–1.0)
Neutro Abs: 2.2 10*3/uL (ref 1.7–7.7)
Neutrophils Relative %: 60 % (ref 43–77)
PLATELETS: 203 10*3/uL (ref 150–400)
RBC: 3.87 MIL/uL (ref 3.87–5.11)
RDW: 13.8 % (ref 11.5–15.5)
WBC: 3.7 10*3/uL — ABNORMAL LOW (ref 4.0–10.5)

## 2015-07-20 LAB — TSH: TSH: 5.947 u[IU]/mL — AB (ref 0.350–4.500)

## 2015-07-21 LAB — VITAMIN D 25 HYDROXY (VIT D DEFICIENCY, FRACTURES): Vit D, 25-Hydroxy: 27 ng/mL — ABNORMAL LOW (ref 30–100)

## 2015-07-21 LAB — CYTOLOGY - PAP

## 2015-07-22 ENCOUNTER — Ambulatory Visit: Payer: Managed Care, Other (non HMO)

## 2015-07-22 ENCOUNTER — Encounter: Payer: Self-pay | Admitting: Gynecology

## 2015-07-24 ENCOUNTER — Telehealth: Payer: Self-pay | Admitting: Gynecology

## 2015-07-24 NOTE — Telephone Encounter (Signed)
Erroneous entry

## 2015-07-29 ENCOUNTER — Ambulatory Visit
Admission: RE | Admit: 2015-07-29 | Discharge: 2015-07-29 | Disposition: A | Discharge status: Home / Self Care | Payer: Managed Care, Other (non HMO) | Source: Ambulatory Visit

## 2015-07-29 DIAGNOSIS — Z1231 Encounter for screening mammogram for malignant neoplasm of breast: Secondary | ICD-10-CM

## 2015-09-04 ENCOUNTER — Encounter: Payer: Self-pay | Admitting: Gynecology

## 2015-09-04 ENCOUNTER — Ambulatory Visit (INDEPENDENT_AMBULATORY_CARE_PROVIDER_SITE_OTHER): Payer: Managed Care, Other (non HMO) | Admitting: Gynecology

## 2015-09-04 VITALS — BP 124/78

## 2015-09-04 DIAGNOSIS — R896 Abnormal cytological findings in specimens from other organs, systems and tissues: Secondary | ICD-10-CM

## 2015-09-04 DIAGNOSIS — IMO0002 Reserved for concepts with insufficient information to code with codable children: Secondary | ICD-10-CM

## 2015-09-04 NOTE — Patient Instructions (Signed)
Office will call you with biopsy results 

## 2015-09-04 NOTE — Progress Notes (Signed)
Thuy Atilano Cosper May 18, 1961 [id number]        54 y.o.  G2P2 presents for colposcopy. 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 Pap smear 2015 with LGSIL positive high-risk HPV. Was recommended to follow up for colposcopy but failed to do so until now. Most recent Pap smear 06/2015 showed atypical squamous cells with positive high-risk HPV.  Past medical history,surgical history, problem list, medications, allergies, family history and social history were all reviewed and documented in the EPIC chart.  Directed ROS with pertinent positives and negatives documented in the history of present illness/assessment and plan.  Exam: [facility] assistant Filed Vitals:   09/04/15 1503  BP: 124/78   General appearance:  Normal External BUS vagina with mild atrophic changes. Cervix grossly normal with atrophic changes.  Colposcopy after acetic acid cleanse is inadequate with TZ not completely seen. No abnormalities noted. ECC performed. Patient tolerated well. Physical Exam  Genitourinary:       Assessment/Plan:  54 y.o. G2P2 with long history of cervical dysplasia and positive high-risk HPV as noted above. I again reviewed with the patient her persistence most likely related to her rheumatoid arthritis and immunosuppression. Colposcopy today showed no abnormalities but was inadequate. ECC performed the patient will follow up for these results. Assuming negative them plan expectant management with follow up Pap smear one year. Otherwise will triage based on results.  Her TSH at her recent visit was elevated. She had this repeated at a work clinic that also was in the 7 range. Will have her follow up with endocrinology for evaluation and probable treatment for hypothyroidism.    Dara Lords MD, 3:31 PM 09/04/2015

## 2015-09-08 ENCOUNTER — Telehealth: Payer: Self-pay | Admitting: *Deleted

## 2015-09-08 DIAGNOSIS — E039 Hypothyroidism, unspecified: Secondary | ICD-10-CM

## 2015-09-08 NOTE — Telephone Encounter (Signed)
Referral placed they will contact pt to schedule. 

## 2015-09-08 NOTE — Telephone Encounter (Signed)
-----   Message from Dara Lords, MD sent at 09/04/2015  3:36 PM EST ----- Arrange appointment with Dr. Ernest Haber reference new onset hypothyroidism in patient with rheumatoid arthritis

## 2015-09-10 NOTE — Telephone Encounter (Signed)
Appointment 10/06/15 @ 8:15am

## 2015-10-06 ENCOUNTER — Encounter: Payer: Self-pay | Admitting: Internal Medicine

## 2015-10-06 ENCOUNTER — Ambulatory Visit (INDEPENDENT_AMBULATORY_CARE_PROVIDER_SITE_OTHER): Payer: Managed Care, Other (non HMO) | Admitting: Internal Medicine

## 2015-10-06 VITALS — BP 128/62 | HR 80 | Temp 97.6°F | Resp 12 | Ht 63.0 in | Wt 137.0 lb

## 2015-10-06 DIAGNOSIS — E039 Hypothyroidism, unspecified: Secondary | ICD-10-CM

## 2015-10-06 LAB — T3, FREE: T3 FREE: 3.8 pg/mL (ref 2.3–4.2)

## 2015-10-06 LAB — T4, FREE: Free T4: 0.63 ng/dL (ref 0.60–1.60)

## 2015-10-06 LAB — TSH: TSH: 6.89 u[IU]/mL — ABNORMAL HIGH (ref 0.35–4.50)

## 2015-10-06 NOTE — Patient Instructions (Signed)
Please stop at the lab.  We may need to start Levothyroxine - I will let you know.  Take the thyroid hormone every day, with water, at least 30 minutes before breakfast, separated by at least 4 hours from: - acid reflux medications - calcium - iron - multivitamins  Please return in 4 months with your sugar log.   Hypothyroidism Hypothyroidism is a disorder of the thyroid. The thyroid is a large gland that is located in the lower front of the neck. The thyroid releases hormones that control how the body works. With hypothyroidism, the thyroid does not make enough of these hormones. CAUSES Causes of hypothyroidism may include:  Viral infections.  Pregnancy.  Your own defense system (immune system) attacking your thyroid.  Certain medicines.  Birth defects.  Past radiation treatments to your head or neck.  Past treatment with radioactive iodine.  Past surgical removal of part or all of your thyroid.  Problems with the gland that is located in the center of your brain (pituitary). SIGNS AND SYMPTOMS Signs and symptoms of hypothyroidism may include:  Feeling as though you have no energy (lethargy).  Inability to tolerate cold.  Weight gain that is not explained by a change in diet or exercise habits.  Dry skin.  Coarse hair.  Menstrual irregularity.  Slowing of thought processes.  Constipation.  Sadness or depression. DIAGNOSIS  Your health care provider may diagnose hypothyroidism with blood tests and ultrasound tests. TREATMENT Hypothyroidism is treated with medicine that replaces the hormones that your body does not make. After you begin treatment, it may take several weeks for symptoms to go away. HOME CARE INSTRUCTIONS   Take medicines only as directed by your health care provider.  If you start taking any new medicines, tell your health care provider.  Keep all follow-up visits as directed by your health care provider. This is important. As your  condition improves, your dosage needs may change. You will need to have blood tests regularly so that your health care provider can watch your condition. SEEK MEDICAL CARE IF:  Your symptoms do not get better with treatment.  You are taking thyroid replacement medicine and:  You sweat excessively.  You have tremors.  You feel anxious.  You lose weight rapidly.  You cannot tolerate heat.  You have emotional swings.  You have diarrhea.  You feel weak. SEEK IMMEDIATE MEDICAL CARE IF:   You develop chest pain.  You develop an irregular heartbeat.  You develop a rapid heartbeat.   This information is not intended to replace advice given to you by your health care provider. Make sure you discuss any questions you have with your health care provider.   Document Released: 10/10/2005 Document Revised: 10/31/2014 Document Reviewed: 02/25/2014 Elsevier Interactive Patient Education [employer]

## 2015-10-06 NOTE — Progress Notes (Signed)
Patient ID: Tiffany Jefferson, female   DOB: 1960/10/25, 54 y.o.   MRN: [id number]   HPI  Tiffany Jefferson is a 54 y.o.-year-old female, referred by her ObGyn Dr., Dr. Audie Box, for management of mild hypothyroidism.  Pt. has had a high TSH since ~2013; she is not on Levothyroxine.  I reviewed pt's thyroid tests: 08/21/2015: TSH 7.510, TT4 7.6 (0.45-4.5) (wellness screen at work) Lab Results  Component Value Date   TSH 5.947* 07/20/2015   TSH 5.795* 03/06/2013   TSH 5.209* 08/27/2012   FREET4 1.11 03/06/2013   Pt describes: - + weight gain (12 lbs in last 1.5 years) - + irritability, + a little depression - last 8 months - + fatigue - no cold intolerance - no constipation - no dry skin - + hair loss  Pt denies feeling nodules in neck, hoarseness, dysphagia/odynophagia, SOB with lying down.  She has + FH of thyroid disorders in: father - hypothyroidism, maternal aunts and cousins. No FH of thyroid cancer.  No h/o radiation tx to head or neck. No recent use of iodine supplements.  She is on Biotin - when she remembers, not in weeks.  I reviewed her chart and she also has a history of RA (on Plaquenil, MTX, Folic acid).  ROS: Constitutional: + see HPI Eyes: no blurry vision, no xerophthalmia ENT: no sore throat, no nodules palpated in throat, no dysphagia/odynophagia, no hoarseness, + tinnitus Cardiovascular: no CP/SOB/palpitations/leg swelling Respiratory: no cough/SOB Gastrointestinal: no N/V/D/C/+ acid reflux Musculoskeletal: no muscle/+ joint aches Skin: no rashes, + hair loss Neurological: no tremors/numbness/tingling/dizziness Psychiatric: no depression/anxiety  Past Medical History  Diagnosis Date  . Rheumatoid arthritis(714.0) 2002  . 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 Depression   . 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 Hair loss   . ASCUS with positive high risk HPV cervical  06/2015   Past Surgical History  Procedure Laterality Date  . Cervical biopsy  w/ loop electrode excision  11/11    CIN II positive endocervical margin  . Finger surgery  2002  . Breast surgery      Benign fibroid mass   Social History   Social History  . Marital Status: Married    Spouse Name: N/A  . Number of Children: 2   Occupational History   Banking clerk   Social History Main Topics  . Smoking status: Never Smoker   . Smokeless tobacco: Never Used  . Alcohol Use: No  . Drug Use: No  . Sexual Activity:    Partners: Male    Birth Control/ Protection: Other-see comments     Comment: vasectomy   Current Outpatient Prescriptions on File Prior to Visit  Medication Sig Dispense Refill  . FOLIC ACID PO Take by mouth.    . hydroxychloroquine (PLAQUENIL) 200 MG tablet Take by mouth daily.    . methotrexate (RHEUMATREX) 2.5 MG tablet Take 2.5 mg by mouth once a week. Caution:Chemotherapy. Protect from light.     No current facility-administered medications on file prior to visit.   Allergies  Allergen Reactions  . Codeine Nausea And Vomiting   Family History  Problem Relation Age of Onset  . Cancer Father     COLON   PE: BP 128/62 mmHg  Pulse 80  Temp(Src) 97.6 F (36.4 C) (Oral)  Resp 12  Ht 5\' 3"  (1.6 m)  Wt 137 lb (62.143 kg)  BMI 24.27 kg/m2  SpO2 99%  LMP 10/25/2007 Wt Readings from Last 3 Encounters:  10/06/15 137 lb (62.143 kg)  07/17/15 133 lb (60.328 kg)  03/07/14 130 lb (58.968 kg)   Constitutional: normal weight, in NAD Eyes: PERRLA, EOMI, no exophthalmos ENT: moist mucous membranes, no thyromegaly, but prominent L thyroid lobe, no cervical lymphadenopathy Cardiovascular: RRR, No MRG Respiratory: CTA B Gastrointestinal: abdomen soft, NT, ND, BS+ Musculoskeletal: no deformities, strength intact in all 4 Skin: moist, warm, no rashes Neurological: no tremor with outstretched hands, DTR normal in all 4  ASSESSMENT: 1. Subclinical  Hypothyroidism  2. Weight gain  PLAN:  1. Patient with mild (subclinical) hypothyroidism, not on levothyroxine therapy. She appears euthyroid, but has some sxs that may be related to hypothyroidism: hair loss, depression, weight gain.  - She does not appear to have a goiter, thyroid nodules, or neck compression symptoms, but her L thyroid lobe is fuller. We may need a thyroid U/S to investigate further. - will check: Orders Placed This Encounter  Procedures  . T4, free  . T3, free  . TSH  . Thyroglobulin antibody  . Thyroid peroxidase antibody  - We discussed about LT4 replacement need in subclinical hypothyroidism:  If pregnancy is contemplate  If TSH >10  If signs and sxs that could be related to hypothyroidism - has some sxs >> we may try LT4 - We discussed about correct intake of levothyroxine, if we need to start: fasting, with water, separated by at least 30 minutes from breakfast, and separated by more than 4 hours from calcium, iron, multivitamins, acid reflux medications (PPIs). - If these are abnormal, she will need to return in 6-8 weeks for repeat labs - If these are normal, I will see her back in 4 months  2. Weight gain - high TSH may contribute >> may need to start LT4 if TSH still high - discussed about cutting down on calories - recommended myfitnesspal app  Component     Latest Ref Rng 10/06/2015  TSH     0.35 - 4.50 uIU/mL 6.89 (H)  Free T4     0.60 - 1.60 ng/dL 6.07  T3, Free     2.3 - 4.2 pg/mL 3.8  Thyroglobulin Ab     <2 IU/mL 1  Thyroperoxidase Ab SerPl-aCnc     <9 IU/mL 106 (H)   New dx of Hashimoto's thyroiditis. TSH still high. Normal fT4 (but on the low side) and normal fT3. I would suggest starting 25 mcg LT4. And recheck labs in 5-6 weeks.

## 2015-10-07 LAB — THYROID PEROXIDASE ANTIBODY: Thyroperoxidase Ab SerPl-aCnc: 106 IU/mL — ABNORMAL HIGH (ref <9)

## 2015-10-07 LAB — THYROGLOBULIN ANTIBODY: THYROGLOBULIN AB: 1 [IU]/mL (ref <2)

## 2015-10-07 MED ORDER — LEVOTHYROXINE SODIUM 25 MCG PO TABS: 25.0000 ug | ORAL_TABLET | Freq: Every day | ORAL | Status: DC

## 2015-10-08 ENCOUNTER — Telehealth: Payer: Self-pay | Admitting: *Deleted

## 2015-10-08 NOTE — Telephone Encounter (Signed)
Pt called requesting lab results. Advised pt per Dr Charlean Sanfilippo results via MyChart. Pt did not read them. Pt voiced understanding, pt curious about side effects of medication, pt scheduled follow up lab appt.

## 2015-11-19 ENCOUNTER — Other Ambulatory Visit: Payer: Self-pay | Admitting: *Deleted

## 2015-11-19 ENCOUNTER — Other Ambulatory Visit (INDEPENDENT_AMBULATORY_CARE_PROVIDER_SITE_OTHER): Payer: Managed Care, Other (non HMO)

## 2015-11-19 DIAGNOSIS — E039 Hypothyroidism, unspecified: Secondary | ICD-10-CM

## 2015-11-19 LAB — TSH: TSH: 4.7 u[IU]/mL — AB (ref 0.35–4.50)

## 2015-11-19 LAB — T4, FREE: FREE T4: 0.78 ng/dL (ref 0.60–1.60)

## 2015-11-19 MED ORDER — LEVOTHYROXINE SODIUM 25 MCG PO TABS: 25.0000 ug | ORAL_TABLET | Freq: Every day | ORAL | Status: DC

## 2015-11-19 MED ORDER — LEVOTHYROXINE SODIUM 25 MCG PO TABS: 50.0000 ug | ORAL_TABLET | Freq: Every day | ORAL | Status: DC

## 2015-11-19 NOTE — Telephone Encounter (Signed)
Pt called for lab results. Requested a

## 2015-11-20 ENCOUNTER — Other Ambulatory Visit: Payer: Self-pay | Admitting: *Deleted

## 2015-11-20 ENCOUNTER — Telehealth: Payer: Self-pay | Admitting: Internal Medicine

## 2015-11-20 MED ORDER — LEVOTHYROXINE SODIUM 50 MCG PO TABS: 50.0000 ug | ORAL_TABLET | Freq: Every day | ORAL | Status: DC

## 2015-11-20 NOTE — Telephone Encounter (Signed)
Yesterday the RX that was called in is not written the way her insurance wants it to be:  The RX for Levothyroxine needs to be written as 50 mcg tablet 1 tablet per day and she needs enough tablets to cover thru April 12 which is her follow up visit it does need to be called into [facility] again please call in the correct dosing she did not pick up the other RX. Please speak with me.  Please contact her if you have any issues. She wants to pick this up at lunch please she goes at 1230.

## 2015-11-20 NOTE — Telephone Encounter (Signed)
Ins is requiring a 90 day supply of her levothyroxine.

## 2015-11-20 NOTE — Telephone Encounter (Signed)
New rx sent for 50 mcg of levothyroxine to pt's pharmacy. Qty#30 with 2 refills.

## 2016-02-03 ENCOUNTER — Telehealth: Payer: Self-pay | Admitting: Internal Medicine

## 2016-02-03 ENCOUNTER — Encounter: Payer: Self-pay | Admitting: Internal Medicine

## 2016-02-03 ENCOUNTER — Ambulatory Visit (INDEPENDENT_AMBULATORY_CARE_PROVIDER_SITE_OTHER): Payer: Managed Care, Other (non HMO) | Admitting: Internal Medicine

## 2016-02-03 VITALS — BP 114/80 | HR 85 | Temp 98.4°F | Wt 139.6 lb

## 2016-02-03 DIAGNOSIS — E063 Autoimmune thyroiditis: Secondary | ICD-10-CM

## 2016-02-03 DIAGNOSIS — E038 Other specified hypothyroidism: Secondary | ICD-10-CM

## 2016-02-03 DIAGNOSIS — K5900 Constipation, unspecified: Secondary | ICD-10-CM | POA: Diagnosis not present

## 2016-02-03 LAB — T4, FREE: FREE T4: 0.92 ng/dL (ref 0.60–1.60)

## 2016-02-03 LAB — TSH: TSH: 2.92 u[IU]/mL (ref 0.35–4.50)

## 2016-02-03 MED ORDER — LEVOTHYROXINE SODIUM 50 MCG PO TABS: 50.0000 ug | ORAL_TABLET | Freq: Every day | ORAL | Status: DC

## 2016-02-03 NOTE — Telephone Encounter (Signed)
Called pt and lvm advising her per Dr Charlean Sanfilippo result note via MyChart. Advised her that Dr Elvera Lennox sent a refill of her thyroid med to her mail order pharmacy.

## 2016-02-03 NOTE — Telephone Encounter (Signed)
PT called in to know if she needs to change her dosage of medications.  She said that it is from the labs she had done this morning.  She also said she can only talk on her lunch hour so she probably wouldn't be able to answer when you call back.

## 2016-02-03 NOTE — Progress Notes (Signed)
Patient ID: Tiffany Jefferson, female   DOB: 11-Aug-1961, 55 y.o.   MRN: [id number]   HPI  Tiffany Jefferson is a 55 y.o.-year-old female, initially referred by her ObGyn Dr., Dr. Audie Box, returning for follow-up for hypothyroidism, with a new diagnosis of Hashimoto's thyroiditis at last visit. Last visit 4 months ago  Pt. has had a high TSH since ~2013; we started Levothyroxine 25 mcg daily in 09/2015, then increased to 50 g daily after last TSH, since this was still high.  I reviewed pt's thyroid tests: Lab Results  Component Value Date   TSH 4.70* 11/19/2015   TSH 6.89* 10/06/2015   TSH 5.947* 07/20/2015   TSH 5.795* 03/06/2013   TSH 5.209* 08/27/2012   FREET4 0.78 11/19/2015   FREET4 0.63 10/06/2015   FREET4 1.11 03/06/2013  08/21/2015: TSH 7.510, TT4 7.6 (0.45-4.5) (wellness screen at work)  Pt describes: - + weight gain  - + constipation - improved fatigue and mental fog - improved irritability and depression - no cold intolerance - no constipation - no dry skin - improved hair loss  Pt denies feeling nodules in neck, hoarseness, dysphagia/odynophagia, SOB with lying down.  She has + FH of thyroid disorders in: father - hypothyroidism, maternal aunts and cousins. No FH of thyroid cancer.  No h/o radiation tx to head or neck. No recent use of iodine supplements.  She is on Biotin - takes it inconsistently, not taken recently.  I reviewed her chart and she also has a history of RA (on Plaquenil, MTX, Folic acid).  ROS: Constitutional: + see HPI Eyes: no blurry vision, no xerophthalmia ENT: no sore throat, no nodules palpated in throat, no dysphagia/odynophagia, no hoarseness Cardiovascular: no CP/SOB/palpitations/leg swelling Respiratory: no cough/SOB Gastrointestinal: no N/V/D/+ C/no acid reflux Musculoskeletal: no muscle/joint aches Skin: no rashes, no hair loss Neurological: no tremors/numbness/tingling/dizziness  I reviewed pt's medications,  allergies, PMH, social hx, family hx, and changes were documented in the history of present illness. Otherwise, unchanged from my initial visit note.  Past Medical History  Diagnosis Date  . Rheumatoid arthritis(714.0) 2002  . 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 Depression   . 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 Hair loss   . ASCUS with positive high risk HPV cervical 06/2015   Past Surgical History  Procedure Laterality Date  . Cervical biopsy  w/ loop electrode excision  11/11    CIN II positive endocervical margin  . Finger surgery  2002  . Breast surgery      Benign fibroid mass   Social History   Social History  . Marital Status: Married    Spouse Name: N/A  . Number of Children: 2   Occupational History   Banking clerk   Social History Main Topics  . Smoking status: Never Smoker   . Smokeless tobacco: Never Used  . Alcohol Use: No  . Drug Use: No  . Sexual Activity:    Partners: Male    Birth Control/ Protection: Other-see comments     Comment: vasectomy   Current Outpatient Prescriptions on File Prior to Visit  Medication Sig Dispense Refill  . FOLIC ACID PO Take by mouth.    . hydroxychloroquine (PLAQUENIL) 200 MG tablet Take by mouth daily.    Marland Kitchen levothyroxine (SYNTHROID, LEVOTHROID) 50 MCG tablet Take 1 tablet (50 mcg total) by mouth daily. 90 tablet 0  . methotrexate (RHEUMATREX) 2.5 MG tablet Take 2.5 mg  by mouth once a week. Caution:Chemotherapy. Protect from light.     No current facility-administered medications on file prior to visit.   Allergies  Allergen Reactions  . Codeine Nausea And Vomiting   Family History  Problem Relation Age of Onset  . Cancer Father     COLON   PE: BP 114/80 mmHg  Pulse 85  Temp(Src) 98.4 F (36.9 C) (Oral)  SpO2 98%  LMP 10/25/2007 Body mass index is 24.74 kg/(m^2). Wt Readings from Last 3 Encounters:  02/03/16 139 lb 9.6 oz (63.322 kg)   10/06/15 137 lb (62.143 kg)  07/17/15 133 lb (60.328 kg)   Constitutional: normal weight, in NAD Eyes: PERRLA, EOMI, no exophthalmos ENT: moist mucous membranes, no thyromegaly, no cervical lymphadenopathy Cardiovascular: RRR, No MRG Respiratory: CTA B Gastrointestinal: abdomen soft, NT, ND, BS+ Musculoskeletal: no deformities, strength intact in all 4 Skin: moist, warm, no rashes Neurological: no tremor with outstretched hands, DTR normal in all 4  ASSESSMENT: 1. Hashimoto's Hypothyroidism  2. Constipation  PLAN:  1. Patient with mild (subclinical) hypothyroidism, on levothyroxine 50 mcg daily. At last visit, she appeared euthyroid, but had some sxs that may be related to hypothyroidism: hair loss, depression, weight gain. As TSH was above the ULN, we started 25 mcg LT4 and then increased to 50 mcg daily as TSH was still slightly high. She feels good on this dose. - She does not appear to have a goiter, thyroid nodules, or neck compression symptoms, but her L thyroid lobe is fuller. We may need a thyroid U/S to investigate further. - We discussed about correct intake of levothyroxine, if we need to start: fasting, with water, separated by at least 30 minutes from breakfast, and separated by more than 4 hours from calcium, iron, multivitamins, acid reflux medications (PPIs).  She is taking it correctly. - We also discussed about her new diagnosis of Hashimoto thyroiditis, the fact that it is an autoimmune disorder, and the fact that it may progress to total hypothyroidism in the future.  - will check a TSH and fT4 today - I will see her back in 6 months  2. Constipation - advised proper hydration - suggested pre- and probiotics - suggested Magnesium supplement 400 or 500 mg daily  Needs refills.  If no dose change >> 90 day supply to [facility] with 1 refill. If change in dose >> 90 day supply to [facility] with 0 refills.   Office Visit on 02/03/2016  Component Date Value Ref Range  Status  . Free T4 02/03/2016 0.92  0.60 - 1.60 ng/dL Final  . TSH 93/81/8299 2.92  0.35 - 4.50 uIU/mL Final   The thyroid tests are great!

## 2016-02-03 NOTE — Telephone Encounter (Signed)
error 

## 2016-02-03 NOTE — Patient Instructions (Signed)
Please continue levothyroxine 50 g daily.  Take the thyroid hormone every day, with water, at least 30 minutes before breakfast, separated by at least 4 hours from: - acid reflux medications - calcium - iron - multivitamins  Please come back for a follow-up appointment in 6 months.

## 2016-02-14 ENCOUNTER — Other Ambulatory Visit: Payer: Self-pay | Admitting: Internal Medicine

## 2016-03-02 ENCOUNTER — Encounter: Payer: Self-pay | Admitting: Gynecology

## 2016-03-02 ENCOUNTER — Other Ambulatory Visit: Payer: Self-pay | Admitting: Gynecology

## 2016-03-02 ENCOUNTER — Ambulatory Visit (INDEPENDENT_AMBULATORY_CARE_PROVIDER_SITE_OTHER): Payer: Managed Care, Other (non HMO) | Admitting: Gynecology

## 2016-03-02 VITALS — BP 120/74

## 2016-03-02 DIAGNOSIS — R896 Abnormal cytological findings in specimens from other organs, systems and tissues: Secondary | ICD-10-CM | POA: Diagnosis not present

## 2016-03-02 DIAGNOSIS — IMO0002 Reserved for concepts with insufficient information to code with codable children: Secondary | ICD-10-CM

## 2016-03-02 LAB — CBC WITH DIFFERENTIAL/PLATELET
BASOS ABS: 0 {cells}/uL (ref 0–200)
Basophils Relative: 0 %
EOS PCT: 1 %
Eosinophils Absolute: 54 cells/uL (ref 15–500)
HCT: 37.5 % (ref 35.0–45.0)
HEMOGLOBIN: 12.7 g/dL (ref 11.7–15.5)
LYMPHS PCT: 25 %
Lymphs Abs: 1350 cells/uL (ref 850–3900)
MCH: 33.2 pg — AB (ref 27.0–33.0)
MCHC: 33.9 g/dL (ref 32.0–36.0)
MCV: 98.2 fL (ref 80.0–100.0)
MONOS PCT: 9 %
MPV: 10.9 fL (ref 7.5–12.5)
Monocytes Absolute: 486 cells/uL (ref 200–950)
NEUTROS PCT: 65 %
Neutro Abs: 3510 cells/uL (ref 1500–7800)
PLATELETS: 245 10*3/uL (ref 140–400)
RBC: 3.82 MIL/uL (ref 3.80–5.10)
RDW: 14 % (ref 11.0–15.0)
WBC: 5.4 10*3/uL (ref 3.8–10.8)

## 2016-03-02 LAB — ALT: ALT: 16 U/L (ref 6–29)

## 2016-03-02 LAB — GAMMA GT: GGT: 12 U/L (ref 7–51)

## 2016-03-02 LAB — ALBUMIN: Albumin: 4.1 g/dL (ref 3.6–5.1)

## 2016-03-02 LAB — AST: AST: 18 U/L (ref 10–35)

## 2016-03-02 NOTE — Patient Instructions (Signed)
Office will call you with Pap smear results. If you do not hear within 2 weeks call the office.

## 2016-03-02 NOTE — Addendum Note (Signed)
Addended by: Dayna Barker on: 03/02/2016 04:12 PM   Modules accepted: Orders

## 2016-03-02 NOTE — Progress Notes (Signed)
    Tiffany Jefferson September 26, 1961 [id number]        55 y.o.  G2P2 presents for follow up Pap smear.  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 Pap smear 2015 with LGSIL positive high-risk HPV. Was recommended to follow up for colposcopy but failed to do so. Pap smear 06/2015 showed ASCUS with positive high risk HPV. Colposcopy was inadequate with ECC negative excepting some mild atypia with inflammation. Recommended for 6 month follow up Pap smear.  Past medical history,surgical history, problem list, medications, allergies, family history and social history were all reviewed and documented in the EPIC chart.  Directed ROS with pertinent positives and negatives documented in the history of present illness/assessment and plan.  Exam: [name] assistant Filed Vitals:   03/02/16 1539  BP: 120/74   General appearance:  Normal Abdomen soft nontender without masses guarding rebound. Pelvic external BUS vagina with mild cystocele and uterine prolapse. Cervix grossly normal. Pap smear/HPV. Uterus normal size midline mobile nontender. Adnexa without masses or tenderness.  Assessment/Plan:  55 y.o. G2P2 with history as above. Pap smear/HPV done today. Patient will follow up for results and we will triage based upon results.    Dara Lords MD, 4:01 PM 03/02/2016

## 2016-03-03 LAB — PAP IG AND HPV HIGH-RISK: HPV DNA High Risk: DETECTED — AB

## 2016-03-09 ENCOUNTER — Telehealth: Payer: Self-pay

## 2016-03-09 LAB — HPV TYPE 16 AND 18/45 RNA
HPV Type 16 RNA: NOT DETECTED
HPV Type 18/45 RNA: NOT DETECTED

## 2016-03-09 NOTE — Telephone Encounter (Signed)
Opened by mistake.

## 2016-03-10 ENCOUNTER — Encounter: Payer: Self-pay | Admitting: Gynecology

## 2016-04-01 ENCOUNTER — Other Ambulatory Visit: Payer: Self-pay | Admitting: Gastroenterology

## 2016-04-06 ENCOUNTER — Emergency Department (HOSPITAL_COMMUNITY)
Admission: EM | Admit: 2016-04-06 | Discharge: 2016-04-06 | Disposition: A | Discharge status: Home / Self Care | Payer: Managed Care, Other (non HMO) | Attending: Emergency Medicine | Admitting: Emergency Medicine

## 2016-04-06 ENCOUNTER — Telehealth: Payer: Self-pay | Admitting: Emergency Medicine

## 2016-04-06 ENCOUNTER — Telehealth: Payer: Self-pay | Admitting: *Deleted

## 2016-04-06 ENCOUNTER — Encounter (HOSPITAL_COMMUNITY): Payer: Self-pay

## 2016-04-06 DIAGNOSIS — Z79899 Other long term (current) drug therapy: Secondary | ICD-10-CM | POA: Insufficient documentation

## 2016-04-06 DIAGNOSIS — R197 Diarrhea, unspecified: Secondary | ICD-10-CM | POA: Insufficient documentation

## 2016-04-06 DIAGNOSIS — H8111 Benign paroxysmal vertigo, right ear: Secondary | ICD-10-CM | POA: Insufficient documentation

## 2016-04-06 DIAGNOSIS — Z853 Personal history of malignant neoplasm of breast: Secondary | ICD-10-CM | POA: Diagnosis not present

## 2016-04-06 DIAGNOSIS — R42 Dizziness and giddiness: Secondary | ICD-10-CM | POA: Diagnosis present

## 2016-04-06 LAB — I-STAT CHEM 8, ED
BUN: 11 mg/dL (ref 6–20)
CALCIUM ION: 1.24 mmol/L — AB (ref 1.12–1.23)
CREATININE: 0.9 mg/dL (ref 0.44–1.00)
Chloride: 104 mmol/L (ref 101–111)
GLUCOSE: 91 mg/dL (ref 65–99)
HCT: 39 % (ref 36.0–46.0)
Hemoglobin: 13.3 g/dL (ref 12.0–15.0)
POTASSIUM: 4.3 mmol/L (ref 3.5–5.1)
Sodium: 143 mmol/L (ref 135–145)
TCO2: 27 mmol/L (ref 0–100)

## 2016-04-06 MED ORDER — MECLIZINE HCL 25 MG PO TABS: 25.0000 mg | ORAL_TABLET | Freq: Three times a day (TID) | ORAL | Status: DC | PRN

## 2016-04-06 MED ORDER — MECLIZINE HCL 25 MG PO TABS
50.0000 mg | ORAL_TABLET | Freq: Once | ORAL | Status: AC
  Administered 2016-04-06: 50 mg via ORAL
  Filled 2016-04-06: qty 2

## 2016-04-06 NOTE — Telephone Encounter (Signed)
Dr.Fontaine please see Dr.Daub telephone call from early this am. Pt asked if she could come her and have labs done? Pt aware we do not do maging here. Please advise

## 2016-04-06 NOTE — ED Notes (Signed)
Patient states she had a colonoscopy prep 6 days ago and afaterwards began having dizziness. Patient states the dizziness decreased, but now dizziness worse.

## 2016-04-06 NOTE — ED Provider Notes (Signed)
CSN: [id number]     Arrival date & time 04/06/16  4098 History   First MD Initiated Contact with Patient 04/06/16 [phone]     Chief Complaint  Patient presents with  . Dizziness     (Consider location/radiation/quality/duration/timing/severity/associated sxs/prior Treatment) Patient is a 55 y.o. female presenting with dizziness. The history is provided by the patient.  Dizziness Quality:  Room spinning Severity:  Moderate Onset quality:  Gradual Duration:  5 days Timing:  Intermittent Progression:  Waxing and waning Chronicity:  New Context: head movement and standing up   Context: not with loss of consciousness   Relieved by:  Nothing Worsened by:  Nothing Ineffective treatments:  None tried Associated symptoms: diarrhea (from colon prep last week)   Associated symptoms: no headaches, no syncope and no vomiting   Risk factors: no anemia and no hx of vertigo     Past Medical History  Diagnosis Date  . Rheumatoid arthritis(714.0) 2002  . 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 Depression   . 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 Hair loss   . ASCUS with positive high risk HPV cervical 06/2015, 02/2016    02/2016 negative subtype 16, 18/45  . Thyroid disease    Past Surgical History  Procedure Laterality Date  . Cervical biopsy  w/ loop electrode excision  11/11    CIN II positive endocervical margin  . Finger surgery  2002  . Breast surgery      Benign fibroid mass  . Colonoscopy     Family History  Problem Relation Age of Onset  . Cancer Father     COLON   Social History  Substance Use Topics  . Smoking status: Never Smoker   . Smokeless tobacco: Never Used  . Alcohol Use: No   OB History    Gravida Para Term Preterm AB TAB SAB Ectopic Multiple Living   2 2        2      Review of Systems  Cardiovascular: Negative for syncope.  Gastrointestinal: Positive for diarrhea (from colon prep last  week). Negative for vomiting.  Neurological: Positive for dizziness. Negative for headaches.  All other systems reviewed and are negative.     Allergies  Codeine  Home Medications   Prior to Admission medications   Medication Sig Start Date End Date Taking? Authorizing Provider  acidophilus (RISAQUAD) CAPS capsule Take 1 capsule by mouth daily.   Yes Historical Provider, MD  folic acid (FOLVITE) 1 MG tablet Take 1 mg by mouth daily. Reported on 04/06/2016 03/01/16  Yes Historical Provider, MD  hydroxychloroquine (PLAQUENIL) 200 MG tablet Take 200 mg by mouth 2 (two) times daily.    Yes Historical Provider, MD  levothyroxine (SYNTHROID, LEVOTHROID) 50 MCG tablet Take 1 tablet (50 mcg total) by mouth daily. 02/03/16  Yes 04/04/16, MD  methotrexate (RHEUMATREX) 2.5 MG tablet Take 17.5 mg by mouth once a week. Caution:Chemotherapy. Protect from light.   Yes Historical Provider, MD   BP 128/81 mmHg  Pulse 75  Temp(Src) 98.1 F (36.7 C) (Oral)  Resp 16  Ht 5\' 3"  (1.6 m)  Wt 138 lb (62.596 kg)  BMI 24.45 kg/m2  SpO2 100%  LMP 10/25/2007 Physical Exam  Constitutional: She is oriented to person, place, and time. She appears well-developed and well-nourished. No distress.  HENT:  Head: Normocephalic.  Eyes: Conjunctivae are normal.  Neck: Neck supple. No tracheal deviation present.  Cardiovascular:  Normal rate and regular rhythm.   Pulmonary/Chest: Effort normal. No respiratory distress.  Abdominal: Soft. She exhibits no distension.  Neurological: She is alert and oriented to person, place, and time. She has normal strength. No cranial nerve deficit or sensory deficit. Coordination and gait normal. GCS eye subscore is 4. GCS verbal subscore is 5. GCS motor subscore is 6.  Right head impulse testing reproduces symptoms. Normal finger to nose testing and rapid alternating movement. Normal heel to shin.   Skin: Skin is warm and dry.  Psychiatric: She has a normal mood and affect.     ED Course  Procedures (including critical care time) Labs Review Labs Reviewed  I-STAT CHEM 8, ED - Abnormal; Notable for the following:    Calcium, Ion 1.24 (*)    All other components within normal limits    Imaging Review No results found. I have personally reviewed and evaluated these images and lab results as part of my medical decision-making.   EKG Interpretation None      MDM   Final diagnoses:  BPPV (benign paroxysmal positional vertigo), right    55 year old female presents with positional vertigo that is worse is normal in nature over the last 5 days since she completed a colon prep. She feels like it is worse in the morning and when she sits forward. I'm able to reproduce her symptoms here on exam and she has no concerning findings for central etiology of vertigo currently. Provided meclizine with good improvement of symptoms. Plan to discharge on meclizine. No significant metabolic or hematologic abnormalities were identified to explain her symptoms either. Plan to follow up with PCP as needed and return precautions discussed for worsening or new concerning symptoms.     Lyndal Pulley, MD 04/06/16 [phone]

## 2016-04-06 NOTE — Telephone Encounter (Signed)
Patient called the answering service morning stating she was having vertigo and dizzy spells. She feels it is related to a prep she had for colonoscopy. I advised the patient she should go to the emergency room to have stat electrolytes done. They would make decision in the ER whether she should have a scan or not. Patient agrees. She will have a family member or neighbor drive her to the hospital.

## 2016-04-06 NOTE — Discharge Instructions (Signed)
Benign Positional Vertigo Vertigo is the feeling that you or your surroundings are moving when they are not. Benign positional vertigo is the most common form of vertigo. The cause of this condition is not serious (is benign). This condition is triggered by certain movements and positions (is positional). This condition can be dangerous if it occurs while you are doing something that could endanger you or others, such as driving.  CAUSES In many cases, the cause of this condition is not known. It may be caused by a disturbance in an area of the inner ear that helps your brain to sense movement and balance. This disturbance can be caused by a viral infection (labyrinthitis), head injury, or repetitive motion. RISK FACTORS This condition is more likely to develop in:  Women.  People who are 55 years of age or older. SYMPTOMS Symptoms of this condition usually happen when you move your head or your eyes in different directions. Symptoms may start suddenly, and they usually last for less than a minute. Symptoms may include:  Loss of balance and falling.  Feeling like you are spinning or moving.  Feeling like your surroundings are spinning or moving.  Nausea and vomiting.  Blurred vision.  Dizziness.  Involuntary eye movement (nystagmus). Symptoms can be mild and cause only slight annoyance, or they can be severe and interfere with daily life. Episodes of benign positional vertigo may return (recur) over time, and they may be triggered by certain movements. Symptoms may improve over time. DIAGNOSIS This condition is usually diagnosed by medical history and a physical exam of the head, neck, and ears. You may be referred to a health care provider who specializes in ear, nose, and throat (ENT) problems (otolaryngologist) or a provider who specializes in disorders of the nervous system (neurologist). You may have additional testing, including:  MRI.  A CT scan.  Eye movement tests. Your  health care provider may ask you to change positions quickly while he or she watches you for symptoms of benign positional vertigo, such as nystagmus. Eye movement may be tested with an electronystagmogram (ENG), caloric stimulation, the Dix-Hallpike test, or the roll test.  An electroencephalogram (EEG). This records electrical activity in your brain.  Hearing tests. TREATMENT Usually, your health care provider will treat this by moving your head in specific positions to adjust your inner ear back to normal. Surgery may be needed in severe cases, but this is rare. In some cases, benign positional vertigo may resolve on its own in 2-4 weeks. HOME CARE INSTRUCTIONS Safety  Move slowly.Avoid sudden body or head movements.  Avoid driving.  Avoid operating heavy machinery.  Avoid doing any tasks that would be dangerous to you or others if a vertigo episode would occur.  If you have trouble walking or keeping your balance, try using a cane for stability. If you feel dizzy or unstable, sit down right away.  Return to your normal activities as told by your health care provider. Ask your health care provider what activities are safe for you. General Instructions  Take over-the-counter and prescription medicines only as told by your health care provider.  Avoid certain positions or movements as told by your health care provider.  Drink enough fluid to keep your urine clear or pale yellow.  Keep all follow-up visits as told by your health care provider. This is important. SEEK MEDICAL CARE IF:  You have a fever.  Your condition gets worse or you develop new symptoms.  Your family or friends   notice any behavioral changes.  Your nausea or vomiting gets worse.  You have numbness or a "pins and needles" sensation. SEEK IMMEDIATE MEDICAL CARE IF:  You have difficulty speaking or moving.  You are always dizzy.  You faint.  You develop severe headaches.  You have weakness in your  legs or arms.  You have changes in your hearing or vision.  You develop a stiff neck.  You develop sensitivity to light.   This information is not intended to replace advice given to you by your health care provider. Make sure you discuss any questions you have with your health care provider.   Document Released: 07/18/2006 Document Revised: 07/01/2015 Document Reviewed: 02/02/2015 Elsevier Interactive Patient Education 2016 [employer]  

## 2016-04-06 NOTE — Telephone Encounter (Signed)
We can do labs here but would not have the results back today. If something was going on it would delay diagnoses. So I do not feel comfortable driving her labs given the situation and not being able to get the results back right away. That is the benefit as far as having it done at the hospital. Also option would be to see Dr. Cleta Alberts at urgent care and let him evaluate her

## 2016-04-06 NOTE — Telephone Encounter (Signed)
Left a detailed on pt voicemail per DPR access.

## 2016-07-18 ENCOUNTER — Other Ambulatory Visit: Payer: Self-pay | Admitting: Gynecology

## 2016-07-18 ENCOUNTER — Encounter: Payer: Managed Care, Other (non HMO) | Admitting: Gynecology

## 2016-07-18 DIAGNOSIS — Z1231 Encounter for screening mammogram for malignant neoplasm of breast: Secondary | ICD-10-CM

## 2016-07-18 LAB — CREATININE, SERUM: Creat: 0.91 mg/dL (ref 0.50–1.05)

## 2016-07-18 LAB — CBC WITH DIFFERENTIAL/PLATELET
BASOS ABS: 42 {cells}/uL (ref 0–200)
Basophils Relative: 1 %
EOS ABS: 84 {cells}/uL (ref 15–500)
Eosinophils Relative: 2 %
HEMATOCRIT: 39 % (ref 35.0–45.0)
Hemoglobin: 13.2 g/dL (ref 11.7–15.5)
LYMPHS PCT: 25 %
Lymphs Abs: 1050 cells/uL (ref 850–3900)
MCH: 33 pg (ref 27.0–33.0)
MCHC: 33.8 g/dL (ref 32.0–36.0)
MCV: 97.5 fL (ref 80.0–100.0)
MONO ABS: 336 {cells}/uL (ref 200–950)
MONOS PCT: 8 %
MPV: 11.4 fL (ref 7.5–12.5)
NEUTROS PCT: 64 %
Neutro Abs: 2688 cells/uL (ref 1500–7800)
PLATELETS: 224 10*3/uL (ref 140–400)
RBC: 4 MIL/uL (ref 3.80–5.10)
RDW: 13.9 % (ref 11.0–15.0)
WBC: 4.2 10*3/uL (ref 3.8–10.8)

## 2016-07-18 LAB — ALT: ALT: 11 U/L (ref 6–29)

## 2016-07-18 LAB — AST: AST: 14 U/L (ref 10–35)

## 2016-07-18 LAB — ALBUMIN: Albumin: 3.9 g/dL (ref 3.6–5.1)

## 2016-07-20 LAB — QUANTIFERON TB GOLD ASSAY (BLOOD)
Interferon Gamma Release Assay: NEGATIVE
MITOGEN-NIL SO: 6.25 [IU]/mL
QUANTIFERON NIL VALUE: 0.02 [IU]/mL
QUANTIFERON TB AG MINUS NIL: 0.01 [IU]/mL

## 2016-07-26 ENCOUNTER — Encounter: Payer: Self-pay | Admitting: Gynecology

## 2016-07-26 ENCOUNTER — Ambulatory Visit (INDEPENDENT_AMBULATORY_CARE_PROVIDER_SITE_OTHER): Payer: Managed Care, Other (non HMO) | Admitting: Gynecology

## 2016-07-26 VITALS — BP 120/70 | Ht 63.0 in | Wt 142.0 lb

## 2016-07-26 DIAGNOSIS — N952 Postmenopausal atrophic vaginitis: Secondary | ICD-10-CM | POA: Diagnosis not present

## 2016-07-26 DIAGNOSIS — R87810 Cervical high risk human papillomavirus (HPV) DNA test positive: Secondary | ICD-10-CM | POA: Diagnosis not present

## 2016-07-26 DIAGNOSIS — E559 Vitamin D deficiency, unspecified: Secondary | ICD-10-CM

## 2016-07-26 DIAGNOSIS — N8189 Other female genital prolapse: Secondary | ICD-10-CM

## 2016-07-26 DIAGNOSIS — Z01419 Encounter for gynecological examination (general) (routine) without abnormal findings: Secondary | ICD-10-CM | POA: Diagnosis not present

## 2016-07-26 DIAGNOSIS — Z13220 Encounter for screening for lipoid disorders: Secondary | ICD-10-CM

## 2016-07-26 DIAGNOSIS — R87610 Atypical squamous cells of undetermined significance on cytologic smear of cervix (ASC-US): Secondary | ICD-10-CM

## 2016-07-26 NOTE — Patient Instructions (Signed)

## 2016-07-26 NOTE — Addendum Note (Signed)
Addended by: Dayna Barker on: 07/26/2016 09:36 AM   Modules accepted: Orders

## 2016-07-26 NOTE — Progress Notes (Signed)
Tiffany Jefferson 02-Mar-1961 [id number]        55 y.o.  G2P2  for annual exam.  Several issues noted below  Past medical history,surgical history, problem list, medications, allergies, family history and social history were all reviewed and documented as reviewed in the EPIC chart.  ROS:  Performed with pertinent positives and negatives included in the history, assessment and plan.   Additional significant findings :  None   Exam: [name] assistant Vitals:   07/26/16 0858  BP: 120/70  Weight: 142 lb (64.4 kg)  Height: 5\' 3"  (1.6 m)   Body mass index is 25.15 kg/m.  General appearance:  Normal affect, orientation and appearance. Skin: Grossly normal HEENT: Without gross lesions.  No cervical or supraclavicular adenopathy. Thyroid normal.  Lungs:  Clear without wheezing, rales or rhonchi Cardiac: RR, without RMG Abdominal:  Soft, nontender, without masses, guarding, rebound, organomegaly or hernia Breasts:  Examined lying and sitting without masses, retractions, discharge or axillary adenopathy. Pelvic:  Ext, BUS, Vagina with atrophic changes.  Second-degree cystocele, first-degree uterine prolapse, first-degree rectocele.  Cervix atrophic. Pap smear/HPV  Uterus anteverted, normal size, shape and contour, midline and mobile nontender   Adnexa without masses or tenderness    Anus and perineum normal   Rectovaginal normal sphincter tone without palpated masses or tenderness.    Assessment/Plan:  55 y.o. G2P2 female for annual exam.   1. Postmenopausal/atrophic genital changes. No significant hot flushes, night sweats, vaginal dryness, vaginal bleeding. Continue monitor report any issues or bleeding. 2. ASCUS/high-risk HPV, negative subtype 16, 18/45 02/2016. Pap smear/HPV today. 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 Pap smear  2015 with LGSIL positive high-risk HPV. Was recommended to follow up for colposcopy but failed to do so. Pap smear 06/2015 showed ASCUS with positive high risk HPV. Colposcopy was inadequate with ECC negative excepting some mild atypia with inflammation.  Pap smear 02/2016 with ASCUS positive high-risk HPV negative subtype 16, 18/45. I again reviewed her whole history with her and probable need for colposcopy of persistent abnormalities. The issues of immunosuppression and persistence of the virus discussed. At this point as long as low-grade changes we'll continue to monitor and follow. 3. Pelvic relaxation. Cystocele, uterine prolapse, rectocele noted. Patient was having some urine leakage previously but actually notes that seems to be better. No significant pressure or protrusion from the vagina symptoms. Options for management reviewed to include observation, trial of pessary, surgery to include TVH A&P repair. At this point patient is not interested in doing anything but observing as it is not overly bothersome to her. Will follow up if more significant symptoms develop. 4. Mammography scheduled next week. Patient will follow up for this. SBE monthly reviewed. 5. Colonoscopy 2017. Repeat at their recommended interval. 6. Health maintenance. Requests baseline labs. Does have a history of low vitamin D in the past. Will check lipid profile, vitamin D level and urine analysis. Has had recent CMP, CBC. Is seeing her thyroid specialist next week and will have her TSH checked there. She did ask me questions about Pneumovax vaccine as recommended by her rheumatologist before starting a new rheumatoid arthritis medication. I deferred her questions back to her specialist telling her that he really needs to be the one to answer these questions and she agrees to following up with him. Follow up for Pap smear results. Follow up  in one year for annual exam.  Greater than 10 minutes of my time in excess of her routine  gynecologic exam was spent in direct face to face counseling and coordination of care in regards to her problems of persistent dysplasia, HPV and pelvic relaxation with management options.    Dara Lords MD, 9:21 AM 07/26/2016

## 2016-07-27 ENCOUNTER — Other Ambulatory Visit: Payer: Self-pay | Admitting: *Deleted

## 2016-07-27 DIAGNOSIS — E559 Vitamin D deficiency, unspecified: Secondary | ICD-10-CM

## 2016-07-27 LAB — LIPID PANEL
CHOL/HDL RATIO: 2.6 ratio (ref <=5.0)
Cholesterol: 132 mg/dL (ref 125–200)
HDL: 51 mg/dL (ref >=46)
LDL CALC: 72 mg/dL (ref <130)
Triglycerides: 46 mg/dL (ref <150)
VLDL: 9 mg/dL (ref <30)

## 2016-07-27 LAB — URINALYSIS W MICROSCOPIC + REFLEX CULTURE
BILIRUBIN URINE: NEGATIVE
Bacteria, UA: NONE SEEN [HPF]
CRYSTALS: NONE SEEN [HPF]
Casts: NONE SEEN [LPF]
Glucose, UA: NEGATIVE
Hgb urine dipstick: NEGATIVE
Ketones, ur: NEGATIVE
NITRITE: NEGATIVE
Protein, ur: NEGATIVE
SPECIFIC GRAVITY, URINE: 1.023 (ref 1.001–1.035)
Yeast: NONE SEEN [HPF]
pH: 5 (ref 5.0–8.0)

## 2016-07-27 LAB — PAP IG AND HPV HIGH-RISK: HPV DNA High Risk: DETECTED — AB

## 2016-07-27 LAB — VITAMIN D 25 HYDROXY (VIT D DEFICIENCY, FRACTURES): Vit D, 25-Hydroxy: 24 ng/mL — ABNORMAL LOW (ref 30–100)

## 2016-07-28 ENCOUNTER — Encounter: Payer: Self-pay | Admitting: Gynecology

## 2016-07-28 LAB — URINE CULTURE: ORGANISM ID, BACTERIA: NO GROWTH

## 2016-08-01 ENCOUNTER — Ambulatory Visit
Admission: RE | Admit: 2016-08-01 | Discharge: 2016-08-01 | Disposition: A | Discharge status: Home / Self Care | Payer: Managed Care, Other (non HMO) | Source: Ambulatory Visit | Attending: Gynecology | Admitting: Gynecology

## 2016-08-01 DIAGNOSIS — Z1231 Encounter for screening mammogram for malignant neoplasm of breast: Secondary | ICD-10-CM

## 2016-08-04 ENCOUNTER — Ambulatory Visit (INDEPENDENT_AMBULATORY_CARE_PROVIDER_SITE_OTHER): Payer: Managed Care, Other (non HMO) | Admitting: Internal Medicine

## 2016-08-04 ENCOUNTER — Encounter: Payer: Self-pay | Admitting: Internal Medicine

## 2016-08-04 VITALS — BP 118/80 | HR 72 | Wt 142.0 lb

## 2016-08-04 DIAGNOSIS — E038 Other specified hypothyroidism: Secondary | ICD-10-CM | POA: Diagnosis not present

## 2016-08-04 DIAGNOSIS — E063 Autoimmune thyroiditis: Secondary | ICD-10-CM

## 2016-08-04 LAB — TSH: TSH: 4.79 u[IU]/mL — AB (ref 0.35–4.50)

## 2016-08-04 LAB — T4, FREE: FREE T4: 0.8 ng/dL (ref 0.60–1.60)

## 2016-08-04 MED ORDER — LEVOTHYROXINE SODIUM 75 MCG PO TABS: 75.0000 ug | ORAL_TABLET | Freq: Every day | ORAL | 1 refills | Status: DC

## 2016-08-04 NOTE — Patient Instructions (Addendum)
Please continue levothyroxine 50 g daily.  Take the thyroid hormone every day, with water, at least 30 minutes before breakfast, separated by at least 4 hours from: - acid reflux medications - calcium - iron - multivitamins  Please stop at the lab.  Please come back for a follow-up appointment in 6 months.

## 2016-08-04 NOTE — Progress Notes (Signed)
Patient ID: LAVAYAH VITA, female   DOB: 1961-07-22, 55 y.o.   MRN: [id number]   HPI  Tiffany Jefferson is a 55 y.o.-year-old female, initially referred by her ObGyn Dr., Dr. Audie Box, returning for follow-up for hypothyroidism 2/2 Hashimoto's thyroiditis at last visit. Last visit 6 months ago.  She had a colonoscopy in June 2017 >> developed severe dehydration and vertigo (BPPV). She had to go to the ED 2 weeks later. Her BPPV now is intermittent.  Reviewed hx: Pt. has had a high TSH since ~2013; we started Levothyroxine 25 mcg daily in 09/2015, then increased to 50 g daily after last TSH, since this was still high. Subsequent TSH was normal:  Lab Results  Component Value Date   TSH 2.92 02/03/2016   TSH 4.70 (H) 11/19/2015   TSH 6.89 (H) 10/06/2015   TSH 5.947 (H) 07/20/2015   TSH 5.795 (H) 03/06/2013   TSH 5.209 (H) 08/27/2012   FREET4 0.92 02/03/2016   FREET4 0.78 11/19/2015   FREET4 0.63 10/06/2015   FREET4 1.11 03/06/2013  08/21/2015: TSH 7.510, TT4 7.6 (0.45-4.5) (wellness screen at work)  She takes LT4: - fasting - with water - eats b'fast >30 min after if she eats - no MVI - no Ca - no PPIs  Pt describes: - + weight gain  - + constipation - no fatigue  - improved irritability and depression - no cold intolerance - no constipation - no dry skin - + hair loss  Pt denies feeling nodules in neck, hoarseness, dysphagia/odynophagia, SOB with lying down.  She has + FH of thyroid disorders in: father - hypothyroidism, maternal aunts and cousins. No FH of thyroid cancer.  No h/o radiation tx to head or neck. No recent use of iodine supplements.  She is not on Biotin.  I reviewed her chart and she also has a history of RA (on Plaquenil, MTX, Folic acid). She will switch to Humira.  ROS: Constitutional: + see HPI Eyes: no blurry vision, no xerophthalmia ENT: no sore throat, no nodules palpated in throat, no dysphagia/odynophagia, no  hoarseness Cardiovascular: no CP/SOB/palpitations/leg swelling Respiratory: no cough/SOB Gastrointestinal: no N/V/D/+ C/no acid reflux Musculoskeletal: no muscle/joint aches Skin: no rashes, + hair loss Neurological: no tremors/numbness/tingling/dizziness  I reviewed pt's medications, allergies, PMH, social hx, family hx, and changes were documented in the history of present illness. Otherwise, unchanged from my initial visit note.  Past Medical History:  Diagnosis Date  . ASCUS with positive high risk HPV cervical 06/2015, 02/2016, 07/2016   02/2016 negative subtype 16, 18/45  . Depression   . Hair loss   . 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 Rheumatoid arthritis(714.0) 2002  . Thyroid disease    Past Surgical History:  Procedure Laterality Date  . BREAST SURGERY     Benign fibroid mass  . CERVICAL BIOPSY  W/ LOOP ELECTRODE EXCISION  11/11   CIN II positive endocervical margin  . COLONOSCOPY    . FINGER SURGERY  2002   Social History   Social History  . Marital Status: Married    Spouse Name: N/A  . Number of Children: 2   Occupational History   Banking clerk   Social History Main Topics  . Smoking status: Never Smoker   . Smokeless tobacco: Never Used  . Alcohol Use: No  . Drug Use: No  . Sexual Activity:    Partners: Male  Birth Control/ Protection: Other-see comments     Comment: vasectomy   Current Outpatient Prescriptions on File Prior to Visit  Medication Sig Dispense Refill  . folic acid (FOLVITE) 1 MG tablet Take 1 mg by mouth daily. Reported on 04/06/2016    . hydroxychloroquine (PLAQUENIL) 200 MG tablet Take 200 mg by mouth 2 (two) times daily.     Marland Kitchen levothyroxine (SYNTHROID, LEVOTHROID) 50 MCG tablet Take 1 tablet (50 mcg total) by mouth daily. 90 tablet 1  . methotrexate (RHEUMATREX) 2.5 MG tablet Take 17.5 mg by mouth once a week.  Caution:Chemotherapy. Protect from light.     No current facility-administered medications on file prior to visit.    Allergies  Allergen Reactions  . Codeine Nausea And Vomiting    Has patient had a PCN reaction causing immediate rash, facial/tongue/throat swelling, SOB or lightheadedness with hypotension: No Has patient had a PCN reaction causing severe rash involving mucus membranes or skin necrosis: No Has patient had a PCN reaction that required hospitalization No Has patient had a PCN reaction occurring within the last 10 years: No If all of the above answers are "NO", then may proceed with Cephalosporin use.   Family History  Problem Relation Age of Onset  . Cancer Father     COLON   PE: BP 118/80 (BP Location: Left Arm, Patient Position: Sitting)   Pulse 72   Wt 142 lb (64.4 kg)   LMP 10/25/2007   SpO2 97%   BMI 25.15 kg/m  Body mass index is 25.15 kg/m. Wt Readings from Last 3 Encounters:  08/04/16 142 lb (64.4 kg)  07/26/16 142 lb (64.4 kg)  04/06/16 138 lb (62.6 kg)   Constitutional: normal weight, in NAD Eyes: PERRLA, EOMI, no exophthalmos ENT: moist mucous membranes, no thyromegaly, no cervical lymphadenopathy Cardiovascular: RRR, No MRG Respiratory: CTA B Gastrointestinal: abdomen soft, NT, ND, BS+ Musculoskeletal: no deformities, strength intact in all 4 Skin: moist, warm, no rashes Neurological: no tremor with outstretched hands, DTR normal in all 4  ASSESSMENT: 1. Hashimoto's Hypothyroidism  If no dose change >> 90 day supply to [facility] with 1 refill. If change in dose >> 90 day supply to [facility] with 0 refills.   PLAN:  1. Patient with mild (subclinical) hypothyroidism 2/2 Hashimoto's thyroiditis, on levothyroxine 50 mcg daily. She appears euthyroid, with resolved hair loss, depression, weight gain. TSH was great on LT4 50 mcg daily. She feels good on this dose. - She does not appear to have a goiter, thyroid nodules, or neck compression symptoms. At  last visit, her L thyroid lobe was slightly fuller, but today the thyroid is more symmetric. We may need to check a thyroid ultrasound in the future, but I do not feel that this is needed for now. - We discussed about correct intake of levothyroxine, if we need to start: fasting, with water, separated by at least 30 minutes from breakfast, and separated by more than 4 hours from calcium, iron, multivitamins, acid reflux medications (PPIs).  She is taking it correctly. - will check a TSH and fT4 today - I will see her back in 6 months  Component     Latest Ref Rng & Units 08/04/2016  TSH     0.35 - 4.50 uIU/mL 4.79 (H)  T4,Free(Direct)     0.60 - 1.60 ng/dL 0.93   TSH is slightly high, will advise to increase the levothyroxine dose to 75 g daily and recheck her tests in 6 weeks.  Carlus Pavlov,  MD PhD [facility] Endocrinology

## 2016-08-12 ENCOUNTER — Encounter: Payer: Self-pay | Admitting: Gynecology

## 2016-08-12 ENCOUNTER — Ambulatory Visit (INDEPENDENT_AMBULATORY_CARE_PROVIDER_SITE_OTHER): Payer: Managed Care, Other (non HMO) | Admitting: Gynecology

## 2016-08-12 VITALS — BP 124/74

## 2016-08-12 DIAGNOSIS — R87610 Atypical squamous cells of undetermined significance on cytologic smear of cervix (ASC-US): Secondary | ICD-10-CM | POA: Diagnosis not present

## 2016-08-12 DIAGNOSIS — R87810 Cervical high risk human papillomavirus (HPV) DNA test positive: Secondary | ICD-10-CM

## 2016-08-12 NOTE — Progress Notes (Signed)
    Tiffany Jefferson [birth year] [id number]        55 y.o.  G2P2 presents for colposcopy. Has a long history of low-grade dysplasia with positive high-risk HPV. Her history is outlined in the 07/26/2016 office note.  Past medical history,surgical history, problem list, medications, allergies, family history and social history were all reviewed and documented in the EPIC chart.  Directed ROS with pertinent positives and negatives documented in the history of present illness/assessment and plan.  Exam: Tiffany Jefferson assistant Vitals:   08/12/16 1501  BP: 124/74   General appearance:  Normal Abdomen soft nontender without masses guarding rebound Pelvic external BUS vagina atrophic with second degree cystocele, first-degree uterine prolapse, first-degree rectocele. Cervix grossly normal and atrophic. Uterus normal size midline mobile nontender. Adnexa without masses or tenderness.  Colposcopy performed after acetic acid cleanse is normal but inadequate with no transformation zone seen. ECC performed. Patient tolerated well. Physical Exam  Genitourinary:       Assessment/Plan:  55 y.o. G2P2 with long history of low-grade dysplasia if positive high-risk HPV was likely secondary to her immunosuppression. I again reviewed the issues of the positive HPV and persistent dysplasia. High-grade versus low-grade, progression versus regression all discussed. Her colposcopy today was normal but no transformation zone seen. ECC performed. Patient will follow up for biopsy results. Assuming normal or low-grade and plan expectant management with follow up Pap smear in one year.    Tiffany Lords MD, 3:14 PM 08/12/2016

## 2016-08-12 NOTE — Patient Instructions (Signed)
office will call you with biopsy results 

## 2016-08-15 ENCOUNTER — Encounter: Payer: Self-pay | Admitting: Gynecology

## 2016-08-15 LAB — PATHOLOGY

## 2016-09-23 ENCOUNTER — Telehealth: Payer: Self-pay | Admitting: Internal Medicine

## 2016-09-23 ENCOUNTER — Other Ambulatory Visit (INDEPENDENT_AMBULATORY_CARE_PROVIDER_SITE_OTHER): Payer: Managed Care, Other (non HMO)

## 2016-09-23 DIAGNOSIS — E038 Other specified hypothyroidism: Secondary | ICD-10-CM

## 2016-09-23 DIAGNOSIS — E063 Autoimmune thyroiditis: Secondary | ICD-10-CM | POA: Diagnosis not present

## 2016-09-23 LAB — TSH: TSH: 0.36 u[IU]/mL (ref 0.35–4.50)

## 2016-09-23 LAB — T4, FREE: FREE T4: 1.11 ng/dL (ref 0.60–1.60)

## 2016-09-23 NOTE — Telephone Encounter (Signed)
Yes, please see prev. My Chart essage sent to pt regarding labs on 08/04/2016.

## 2016-09-23 NOTE — Telephone Encounter (Signed)
Pt called and wanted to know if she needs to come in to do lab work between now and her appointment in April since the medication was changed.

## 2016-09-23 NOTE — Telephone Encounter (Signed)
Ov 08/04/16  Patient was to return in 6 weeks for f/u lab work LVM to call back to schedule lab appointment.

## 2016-10-31 ENCOUNTER — Telehealth: Payer: Self-pay | Admitting: Internal Medicine

## 2016-10-31 ENCOUNTER — Other Ambulatory Visit: Payer: Self-pay

## 2016-10-31 ENCOUNTER — Encounter: Payer: Self-pay | Admitting: Internal Medicine

## 2016-10-31 ENCOUNTER — Other Ambulatory Visit: Payer: Self-pay | Admitting: Internal Medicine

## 2016-10-31 MED ORDER — LEVOTHYROXINE SODIUM 75 MCG PO TABS: 75.0000 ug | ORAL_TABLET | Freq: Every day | ORAL | 1 refills | Status: DC

## 2016-10-31 MED ORDER — LEVOTHYROXINE SODIUM 75 MCG PO TABS: 75.0000 ug | ORAL_TABLET | Freq: Every day | ORAL | 0 refills | Status: DC

## 2016-10-31 NOTE — Telephone Encounter (Signed)
Sent in RX

## 2016-10-31 NOTE — Telephone Encounter (Signed)
RX sent to [facility].

## 2016-10-31 NOTE — Telephone Encounter (Signed)
The patient needs this levothyroxine (SYNTHROID, LEVOTHROID) 75 MCG tablet Rx sent to:  [facility] - [place], [place] - [address] instead of: [facility] [place], [place] - [address]  She needs this Rx Qty 90 days  She would also like to have the nurse to call her at [phone].

## 2016-10-31 NOTE — Telephone Encounter (Signed)
Pt called and would like to know what is going on with her Levothyroxine, she said that she did some lab work recently and said that Dr. Elvera Lennox didn't call in anymore of her medication, so she needs to know what to do.

## 2016-10-31 NOTE — Telephone Encounter (Signed)
She didn't let me know that she needed a refill. Ok to refill the 75 g of levothyroxine.

## 2016-11-01 ENCOUNTER — Telehealth: Payer: Self-pay

## 2016-11-01 NOTE — Telephone Encounter (Signed)
Called back and gave number if patient had questions.

## 2016-11-01 NOTE — Telephone Encounter (Signed)
Left message and advised that I had sent in RX. Gave call back number if she would like to speak to me.

## 2016-11-02 MED ORDER — LEVOTHYROXINE SODIUM 75 MCG PO TABS: 75.0000 ug | ORAL_TABLET | Freq: Every day | ORAL | 0 refills | Status: DC

## 2016-11-07 ENCOUNTER — Other Ambulatory Visit: Payer: Managed Care, Other (non HMO)

## 2016-11-07 ENCOUNTER — Other Ambulatory Visit: Payer: Self-pay | Admitting: Gynecology

## 2016-11-07 DIAGNOSIS — E559 Vitamin D deficiency, unspecified: Secondary | ICD-10-CM

## 2016-11-08 LAB — CBC WITH DIFFERENTIAL/PLATELET
BASOS ABS: 71 {cells}/uL (ref 0–200)
Basophils Relative: 1 %
EOS PCT: 1 %
Eosinophils Absolute: 71 cells/uL (ref 15–500)
HCT: 37.3 % (ref 35.0–45.0)
HEMOGLOBIN: 12.6 g/dL (ref 11.7–15.5)
LYMPHS ABS: 1278 {cells}/uL (ref 850–3900)
LYMPHS PCT: 18 %
MCH: 32.7 pg (ref 27.0–33.0)
MCHC: 33.8 g/dL (ref 32.0–36.0)
MCV: 96.9 fL (ref 80.0–100.0)
MPV: 11.2 fL (ref 7.5–12.5)
Monocytes Absolute: 568 cells/uL (ref 200–950)
Monocytes Relative: 8 %
NEUTROS PCT: 72 %
Neutro Abs: 5112 cells/uL (ref 1500–7800)
Platelets: 231 10*3/uL (ref 140–400)
RBC: 3.85 MIL/uL (ref 3.80–5.10)
RDW: 13.3 % (ref 11.0–15.0)
WBC: 7.1 10*3/uL (ref 3.8–10.8)

## 2016-11-08 LAB — VITAMIN D 25 HYDROXY (VIT D DEFICIENCY, FRACTURES): VIT D 25 HYDROXY: 41 ng/mL (ref 30–100)

## 2016-11-08 LAB — CREATININE, SERUM: CREATININE: 0.78 mg/dL (ref 0.50–1.05)

## 2016-11-08 LAB — AST: AST: 20 U/L (ref 10–35)

## 2016-11-08 LAB — ALT: ALT: 28 U/L (ref 6–29)

## 2016-11-08 LAB — ALBUMIN: Albumin: 3.9 g/dL (ref 3.6–5.1)

## 2016-11-10 ENCOUNTER — Encounter: Payer: Self-pay | Admitting: Internal Medicine

## 2016-11-28 ENCOUNTER — Ambulatory Visit (INDEPENDENT_AMBULATORY_CARE_PROVIDER_SITE_OTHER): Payer: Managed Care, Other (non HMO) | Admitting: Family Medicine

## 2016-11-28 VITALS — BP 110/76 | HR 102 | Temp 98.1°F | Resp 16 | Ht 63.0 in | Wt 140.2 lb

## 2016-11-28 DIAGNOSIS — J329 Chronic sinusitis, unspecified: Secondary | ICD-10-CM

## 2016-11-28 MED ORDER — FLUCONAZOLE 150 MG PO TABS: 150.0000 mg | ORAL_TABLET | Freq: Once | ORAL | 0 refills | Status: AC

## 2016-11-28 MED ORDER — BENZONATATE 100 MG PO CAPS: 100.0000 mg | ORAL_CAPSULE | Freq: Three times a day (TID) | ORAL | 0 refills | Status: DC | PRN

## 2016-11-28 MED ORDER — AMOXICILLIN-POT CLAVULANATE 875-125 MG PO TABS: 1.0000 | ORAL_TABLET | Freq: Two times a day (BID) | ORAL | 0 refills | Status: DC

## 2016-11-28 NOTE — Progress Notes (Signed)
Patient ID: Tiffany Jefferson, female    DOB: Oct 15, 1961, 56 y.o.   MRN: [id number]  PCP: No primary care provider on file.  Chief Complaint  Patient presents with  . Cough    fever last night, vertigo, post nasal drip,body aches, feel lightheaded since Thursday , greenish yellow plegm/mucus    Subjective:  HPI  56 year old female presents for evaluation of cough, resolved fever, and post nasal drip. Chronic condition includes immunosuppressant therapy for  rheumatoid arthritis. January 12th last Humira injection and she is no longer receiving Humira and is only taking weekly methotrexate as her immunosuppression therapy for Rheumatoid Arthritis. Reports fever x 5 days ago. TMax 101.2. Feels fever resolved last evening.  Post nasal drip has persisted since then and now reports experiencing vertigo. Pt reports hx of vertigo June 2017. Drinking only Pedia-Lyte. Been eating soup since Saturday. Ears are itching although non-painful. Cough persisted since early January. No wheezing or shortness of breath. She has tried a nasal rinse caused a nasal bleed. Also has taken Mucinex occasional.  Social History   Social History  . Marital status: Married    Spouse name: N/A  . Number of children: N/A  . Years of education: N/A   Occupational History  . Not on file.   Social History Main Topics  . Smoking status: Never Smoker  . Smokeless tobacco: Never Used  . Alcohol use No  . Drug use: No  . Sexual activity: Yes    Partners: Male    Birth control/ protection: Other-see comments     Comment: vasectomy-1st intercourse 24 yo-5 partners   Other Topics Concern  . Not on file   Social History Narrative  . No narrative on file   Family History  Problem Relation Age of Onset  . Cancer Father     COLON   Review of Systems See HPI  Patient Active Problem List   Diagnosis Date Noted  . Hypothyroidism due to Hashimoto's thyroiditis 02/03/2016   Prior to Admission  medications   Medication Sig Start Date End Date Taking? Authorizing Provider  folic acid (FOLVITE) 1 MG tablet Take 1 mg by mouth daily. Reported on 04/06/2016 03/01/16  Yes Historical Provider, MD  levothyroxine (SYNTHROID, LEVOTHROID) 75 MCG tablet Take 1 tablet (75 mcg total) by mouth daily before breakfast. 11/02/16  Yes Carlus Pavlov, MD  methotrexate (RHEUMATREX) 2.5 MG tablet Take 17.5 mg by mouth once a week. Caution:Chemotherapy. Protect from light.   Yes Historical Provider, MD  Adalimumab (HUMIRA) 40 MG/0.8ML PSKT Inject 40 mg into the skin. 07/14/16   Historical Provider, MD  hydroxychloroquine (PLAQUENIL) 200 MG tablet Take 200 mg by mouth 2 (two) times daily.     Historical Provider, MD    Past Medical, Surgical Family and Social History reviewed and updated.    Objective:   Today's Vitals   11/28/16 0849  BP: 110/76  Pulse: (!) 102  Resp: 16  Temp: 98.1 F (36.7 C)  TempSrc: Oral  SpO2: 96%  Weight: 140 lb 3.2 oz (63.6 kg)  Height: 5\' 3"  (1.6 m)   Wt Readings from Last 3 Encounters:  11/28/16 140 lb 3.2 oz (63.6 kg)  08/04/16 142 lb (64.4 kg)  07/26/16 142 lb (64.4 kg)   Physical Exam  Constitutional: She is oriented to person, place, and time. She appears well-developed and well-nourished.  HENT:  Head: Normocephalic and atraumatic.  Nose: Mucosal edema, rhinorrhea and sinus tenderness present.  Mouth/Throat: No oropharyngeal exudate,  posterior oropharyngeal edema, posterior oropharyngeal erythema or tonsillar abscesses.  Eyes: Conjunctivae and EOM are normal. Pupils are equal, round, and reactive to light.  Neck: Normal range of motion. Neck supple. No thyromegaly present.  Cardiovascular: Normal rate, regular rhythm, normal heart sounds and intact distal pulses.   Pulmonary/Chest: Effort normal and breath sounds normal.  Musculoskeletal: Normal range of motion.  Neurological: She is alert and oriented to person, place, and time.  Skin: Skin is warm and dry.   Psychiatric: She has a normal mood and affect. Her behavior is normal. Thought content normal.     Assessment & Plan:  1. Sinusitis, unspecified chronicity, unspecified location Pt likely experience influenza or influenza like illness over the past 5 days. Tami-flu is not indicated as the coarse of far exceeds 48 hour window for anti-viral initiation. Symptoms and exam are consistent for an acute sinusitis . Treating empirically.  Plan: Start Augmentin 1 tablet twice daily with food to avoid stomach upset x 10 days.Complete all medication.  Take Diflucan on day 3 of antibiotic therapy. You may repeat in 3 days if irritation develops or worsens.  For cough, take benzonatate 100-200 mg up to 3 times daily as need for cough.   Godfrey Pick. Tiburcio Pea, MSN, FNP-C Primary Care at [facility] [phone]

## 2016-11-28 NOTE — Patient Instructions (Addendum)
Start Augmentin 1 tablet twice daily with food to avoid stomach upset x 10 days. Complete all medication.  Take Diflucan on day 3 of antibiotic therapy. You may repeat in 3 days if irritation develops or worsens.  For cough, take benzonatate 100-200 mg up to 3 times daily as need for cough.   IF you received an x-ray today, you will receive an invoice from [facility]. Please contact [facility] at [phone] with questions or concerns regarding your invoice.   IF you received labwork today, you will receive an invoice from [facility]. Please contact [facility] at [phone] with questions or concerns regarding your invoice.   Our billing staff will not be able to assist you with questions regarding bills from these companies.  You will be contacted with the lab results as soon as they are available. The fastest way to get your results is to activate your My Chart account. Instructions are located on the last page of this paperwork. If you have not heard from [place] regarding the results in 2 weeks, please contact this office.      Sinusitis, Adult Sinusitis is soreness and inflammation of your sinuses. Sinuses are hollow spaces in the bones around your face. They are located:  Around your eyes.  In the middle of your forehead.  Behind your nose.  In your cheekbones. Your sinuses and nasal passages are lined with a stringy fluid (mucus). Mucus normally drains out of your sinuses. When your nasal tissues get inflamed or swollen, the mucus can get trapped or blocked so air cannot flow through your sinuses. This lets bacteria, viruses, and funguses grow, and that leads to infection. Follow these instructions at home: Medicines  Take, use, or apply over-the-counter and prescription medicines only as told by your doctor. These may include nasal sprays.  If you were prescribed an antibiotic medicine, take it as told by your doctor. Do not stop taking the antibiotic even if  you start to feel better. Hydrate and Humidify  Drink enough water to keep your pee (urine) clear or pale yellow.  Use a cool mist humidifier to keep the humidity level in your home above 50%.  Breathe in steam for 10-15 minutes, 3-4 times a day or as told by your doctor. You can do this in the bathroom while a hot shower is running.  Try not to spend time in cool or dry air. Rest  Rest as much as possible.  Sleep with your head raised (elevated).  Make sure to get enough sleep each night. General instructions  Put a warm, moist washcloth on your face 3-4 times a day or as told by your doctor. This will help with discomfort.  Wash your hands often with soap and water. If there is no soap and water, use hand sanitizer.  Do not smoke. Avoid being around people who are smoking (secondhand smoke).  Keep all follow-up visits as told by your doctor. This is important. Contact a doctor if:  You have a fever.  Your symptoms get worse.  Your symptoms do not get better within 10 days. Get help right away if:  You have a very bad headache.  You cannot stop throwing up (vomiting).  You have pain or swelling around your face or eyes.  You have trouble seeing.  You feel confused.  Your neck is stiff.  You have trouble breathing. This information is not intended to replace advice given to you by your health care provider. Make sure you discuss any  questions you have with your health care provider. Document Released: 03/28/2008 Document Revised: 06/05/2016 Document Reviewed: 08/05/2015 Elsevier Interactive Patient Education  2017 [employer].

## 2016-11-29 ENCOUNTER — Telehealth: Payer: Self-pay

## 2016-11-29 NOTE — Telephone Encounter (Signed)
Pt states that the current cough meds first made her sleepy and then made very anxious and she would like to try a different cough medication   Best number [phone]

## 2016-11-29 NOTE — Telephone Encounter (Signed)
Pt advised to stop taking Tessalon and take Desylm over the counter Verbalized understanding

## 2017-01-31 ENCOUNTER — Telehealth: Payer: Self-pay | Admitting: Internal Medicine

## 2017-01-31 ENCOUNTER — Other Ambulatory Visit: Payer: Self-pay

## 2017-01-31 ENCOUNTER — Ambulatory Visit (INDEPENDENT_AMBULATORY_CARE_PROVIDER_SITE_OTHER): Payer: Managed Care, Other (non HMO) | Admitting: Internal Medicine

## 2017-01-31 ENCOUNTER — Encounter: Payer: Self-pay | Admitting: Internal Medicine

## 2017-01-31 ENCOUNTER — Telehealth: Payer: Self-pay

## 2017-01-31 VITALS — BP 112/72 | HR 82 | Wt 144.0 lb

## 2017-01-31 DIAGNOSIS — E038 Other specified hypothyroidism: Secondary | ICD-10-CM

## 2017-01-31 DIAGNOSIS — E063 Autoimmune thyroiditis: Secondary | ICD-10-CM | POA: Diagnosis not present

## 2017-01-31 LAB — T4, FREE: Free T4: 1.13 ng/dL (ref 0.60–1.60)

## 2017-01-31 LAB — TSH: TSH: 0.59 u[IU]/mL (ref 0.35–4.50)

## 2017-01-31 MED ORDER — LEVOTHYROXINE SODIUM 75 MCG PO TABS: 75.0000 ug | ORAL_TABLET | ORAL | 3 refills | Status: DC

## 2017-01-31 MED ORDER — LEVOTHYROXINE SODIUM 50 MCG PO TABS: 50.0000 ug | ORAL_TABLET | ORAL | 3 refills | Status: DC

## 2017-01-31 NOTE — Patient Instructions (Signed)
Please continue 75 mcg daily.  Take the thyroid hormone every day, with water, at least 30 minutes before breakfast, separated by at least 4 hours from: - acid reflux medications - calcium - iron - multivitamins  Please stop at the lab.  Please come back for a follow-up appointment in 6 months.

## 2017-01-31 NOTE — Telephone Encounter (Signed)
Pt states the methotrexate is to be 25 mg please

## 2017-01-31 NOTE — Telephone Encounter (Signed)
Changed med list per patient.

## 2017-01-31 NOTE — Telephone Encounter (Signed)
Change med list, per patient.

## 2017-01-31 NOTE — Progress Notes (Signed)
Patient ID: Tiffany Jefferson, female   DOB: [birth year], 56 y.o.   MRN: [id number]   HPI  Tiffany Jefferson is a 56 y.o.-year-old female, initially referred by her ObGyn Dr., Dr. Audie Box, returning for follow-up for hypothyroidism 2/2 Hashimoto's thyroiditis at last visit. Last visit 6 months ago.  Reviewed and addended hx: Pt. has had a high TSH since ~2013; we started Levothyroxine 25 mcg daily in 09/2015.   At last visit, as TSH was high, we increased the dose to 75 g daily. A subsequent TSH was in the low-normal range:  Lab Results  Component Value Date   TSH 0.36 09/23/2016   TSH 4.79 (H) 08/04/2016   TSH 2.92 02/03/2016   TSH 4.70 (H) 11/19/2015   TSH 6.89 (H) 10/06/2015   TSH 5.947 (H) 07/20/2015   TSH 5.795 (H) 03/06/2013   TSH 5.209 (H) 08/27/2012   FREET4 1.11 09/23/2016   FREET4 0.80 08/04/2016   FREET4 0.92 02/03/2016   FREET4 0.78 11/19/2015   FREET4 0.63 10/06/2015   FREET4 1.11 03/06/2013  08/21/2015: TSH 7.510, TT4 7.6 (0.45-4.5) (wellness screen at work)  She takes LT4: - fasting - with water - eats b'fast >30 min after if she eats - no MVI - no Ca - no PPIs  Pt describes: - + weight gain + loss - no constipation - no fatigue  - + depression - no cold intolerance - no constipation - no dry skin - no hair loss  Pt denies feeling nodules in neck, hoarseness, dysphagia/odynophagia, SOB with lying down.  She has + FH of thyroid disorders in: father - hypothyroidism, maternal aunts and cousins. No FH of thyroid cancer.  No h/o radiation tx to head or neck. No recent use of iodine supplements.  She is not on Biotin.  I reviewed her chart and she also has a history of RA (on Plaquenil, Folic acid). She was taken off po MTX >> now on injectable MTX - started in 12/2016. She feels more depressed after the switch.  ROS: Constitutional: + see HPI Eyes: no blurry vision, no xerophthalmia ENT: no sore throat, no nodules palpated in throat, no  dysphagia/odynophagia, no hoarseness Cardiovascular: no CP/SOB/palpitations/leg swelling Respiratory: no cough/SOB Gastrointestinal: no N/V/D/C/acid reflux Musculoskeletal: no muscle/joint aches Skin: no rashes, no hair loss Neurological: no tremors/numbness/tingling/dizziness  I reviewed pt's medications, allergies, PMH, social hx, family hx, and changes were documented in the history of present illness. Otherwise, unchanged from my initial visit note.  Past Medical History:  Diagnosis Date  . ASCUS with positive high risk HPV cervical 06/2015, 02/2016, 07/2016   02/2016 negative subtype 16, 18/45, colposcopy negative 2017 with ECC showing koilocytotic atypia  . Depression   . Hair loss   . 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 Rheumatoid arthritis(714.0) 2002   Past Surgical History:  Procedure Laterality Date  . BREAST SURGERY     Benign fibroid mass  . CERVICAL BIOPSY  W/ LOOP ELECTRODE EXCISION  11/11   CIN II positive endocervical margin  . COLONOSCOPY    . FINGER SURGERY  2002   Social History   Social History  . Marital Status: Married    Spouse Name: N/A  . Number of Children: 2   Occupational History   Banking clerk   Social History Main Topics  . Smoking status: Never Smoker   . Smokeless tobacco: Never Used  . Alcohol  Use: No  . Drug Use: No  . Sexual Activity:    Partners: Male    Birth Control/ Protection: Other-see comments     Comment: vasectomy   Current Outpatient Prescriptions on File Prior to Visit  Medication Sig Dispense Refill  . folic acid (FOLVITE) 1 MG tablet Take 1 mg by mouth daily. Reported on 04/06/2016    . levothyroxine (SYNTHROID, LEVOTHROID) 75 MCG tablet Take 1 tablet (75 mcg total) by mouth daily before breakfast. 90 tablet 0   No current facility-administered medications on file prior to visit.    Allergies  Allergen  Reactions  . Codeine Nausea And Vomiting       Family History  Problem Relation Age of Onset  . Cancer Father     COLON   PE: BP 112/72 (BP Location: Left Arm, Patient Position: Sitting)   Pulse 82   LMP 10/25/2007   SpO2 95%  Body mass index is 25.51 kg/m. Wt Readings from Last 3 Encounters:  11/28/16 140 lb 3.2 oz (63.6 kg)  08/04/16 142 lb (64.4 kg)  07/26/16 142 lb (64.4 kg)   Constitutional: normal weight, in NAD Eyes: PERRLA, EOMI, no exophthalmos ENT: moist mucous membranes, no thyromegaly, no cervical lymphadenopathy Cardiovascular: RRR, No MRG Respiratory: CTA B Gastrointestinal: abdomen soft, NT, ND, BS+ Musculoskeletal: no deformities, strength intact in all 4 Skin: moist, warm, no rashes Neurological: no tremor with outstretched hands, DTR +3/5 in all 4  ASSESSMENT: 1. Hashimoto's Hypothyroidism  If no dose change >> 90 day supply to [facility] with 1 refill. If change in dose >> 90 day supply to [facility] with 0 refills.   PLAN:  1. Patient with Hashimoto'shypothyroidism 2/2 Hashimoto's thyroiditis, on levothyroxine 75 mcg daily. She appears euthyroid, with resolved hair loss, but c/o depression, weight gain. She is not sure if the sxs are 2/2 change in LT4 dose or switch to injectable MTX. TSH was at the [facility] on LT4 75 mcg daily. We may need to decrease the dose to alternate 50 with 75 mcg qod. - She does not appear to have a goiter, thyroid nodules, or neck compression symptoms.  - We discussed about correct intake of levothyroxine, if we need to start: fasting, with water, separated by at least 30 minutes from breakfast, and separated by more than 4 hours from calcium, iron, multivitamins, acid reflux medications (PPIs).  She is taking it correctly. - will check a TSH and fT4 today - I will see her back in 6 months  Needs 90 day supply to [facility].  Component     Latest Ref Rng & Units 01/31/2017  TSH     0.35 - 4.50 uIU/mL 0.59  T4,Free(Direct)     0.60 - 1.60  ng/dL 7.61   TFTs are normal, but as she is not feeling great on this LT4 dose, we can alternate the doses as discussed. Will have her back for labs in 5 weeks after the change in dose.  Carlus Pavlov, MD PhD [facility] Endocrinology

## 2017-02-22 ENCOUNTER — Telehealth: Payer: Self-pay | Admitting: *Deleted

## 2017-02-22 DIAGNOSIS — M059 Rheumatoid arthritis with rheumatoid factor, unspecified: Secondary | ICD-10-CM

## 2017-02-22 NOTE — Telephone Encounter (Signed)
Pt called stating [facility] rheumatologist) would like for pt to have AST,ALT,CBC,ALBUMIN,CREATINE level drawn. Pt said she has had this done here before, rheumatologist can see her results via epic. Pt said he wants labs drawn every 3 months and pt was going to schedule appointment for may 7th and August 21st at 4:30pm. She will then have annual visit with him to have new orders x 1 year. Okay to place orders?

## 2017-02-22 NOTE — Telephone Encounter (Signed)
Okay 

## 2017-02-23 NOTE — Telephone Encounter (Signed)
Orders placed for lab

## 2017-02-27 ENCOUNTER — Other Ambulatory Visit: Payer: Managed Care, Other (non HMO)

## 2017-02-27 ENCOUNTER — Other Ambulatory Visit: Payer: Self-pay | Admitting: Gynecology

## 2017-02-27 DIAGNOSIS — M059 Rheumatoid arthritis with rheumatoid factor, unspecified: Secondary | ICD-10-CM

## 2017-02-27 LAB — CBC
HEMATOCRIT: 38.1 % (ref 35.0–45.0)
HEMOGLOBIN: 12.9 g/dL (ref 11.7–15.5)
MCH: 32.4 pg (ref 27.0–33.0)
MCHC: 33.9 g/dL (ref 32.0–36.0)
MCV: 95.7 fL (ref 80.0–100.0)
MPV: 11.6 fL (ref 7.5–12.5)
Platelets: 279 10*3/uL (ref 140–400)
RBC: 3.98 MIL/uL (ref 3.80–5.10)
RDW: 14.5 % (ref 11.0–15.0)
WBC: 5.9 10*3/uL (ref 3.8–10.8)

## 2017-02-27 LAB — ALT: ALT: 11 U/L (ref 6–29)

## 2017-02-27 LAB — AST: AST: 16 U/L (ref 10–35)

## 2017-02-27 LAB — ALBUMIN: ALBUMIN: 4 g/dL (ref 3.6–5.1)

## 2017-02-28 LAB — CREATININE, SERUM: Creat: 0.76 mg/dL (ref 0.50–1.05)

## 2017-03-06 ENCOUNTER — Other Ambulatory Visit: Payer: Managed Care, Other (non HMO)

## 2017-03-07 ENCOUNTER — Other Ambulatory Visit: Payer: Managed Care, Other (non HMO)

## 2017-03-08 ENCOUNTER — Other Ambulatory Visit: Payer: Managed Care, Other (non HMO)

## 2017-03-14 ENCOUNTER — Other Ambulatory Visit (INDEPENDENT_AMBULATORY_CARE_PROVIDER_SITE_OTHER): Payer: Managed Care, Other (non HMO)

## 2017-03-14 DIAGNOSIS — E063 Autoimmune thyroiditis: Secondary | ICD-10-CM

## 2017-03-14 DIAGNOSIS — E038 Other specified hypothyroidism: Secondary | ICD-10-CM

## 2017-03-14 LAB — TSH: TSH: 3.13 u[IU]/mL (ref 0.35–4.50)

## 2017-03-14 LAB — T4, FREE: FREE T4: 1.08 ng/dL (ref 0.60–1.60)

## 2017-06-13 ENCOUNTER — Other Ambulatory Visit: Payer: Managed Care, Other (non HMO)

## 2017-06-15 ENCOUNTER — Other Ambulatory Visit: Payer: Managed Care, Other (non HMO)

## 2017-06-19 ENCOUNTER — Other Ambulatory Visit: Payer: Self-pay | Admitting: Gynecology

## 2017-06-19 ENCOUNTER — Other Ambulatory Visit: Payer: Managed Care, Other (non HMO)

## 2017-06-19 DIAGNOSIS — M059 Rheumatoid arthritis with rheumatoid factor, unspecified: Secondary | ICD-10-CM

## 2017-06-19 LAB — CBC
HEMATOCRIT: 39.5 % (ref 35.0–45.0)
HEMOGLOBIN: 13.3 g/dL (ref 11.7–15.5)
MCH: 33.2 pg — ABNORMAL HIGH (ref 27.0–33.0)
MCHC: 33.7 g/dL (ref 32.0–36.0)
MCV: 98.5 fL (ref 80.0–100.0)
MPV: 11.4 fL (ref 7.5–12.5)
PLATELETS: 244 10*3/uL (ref 140–400)
RBC: 4.01 MIL/uL (ref 3.80–5.10)
RDW: 13.3 % (ref 11.0–15.0)
WBC: 5.2 10*3/uL (ref 3.8–10.8)

## 2017-06-20 LAB — ALT: ALT: 10 U/L (ref 6–29)

## 2017-06-20 LAB — ALBUMIN: Albumin: 4.2 g/dL (ref 3.6–5.1)

## 2017-06-20 LAB — AST: AST: 14 U/L (ref 10–35)

## 2017-06-20 LAB — CREATININE, SERUM: Creat: 0.79 mg/dL (ref 0.50–1.05)

## 2017-07-10 ENCOUNTER — Other Ambulatory Visit: Payer: Self-pay | Admitting: Gynecology

## 2017-07-10 DIAGNOSIS — Z1231 Encounter for screening mammogram for malignant neoplasm of breast: Secondary | ICD-10-CM

## 2017-07-31 ENCOUNTER — Encounter: Payer: Managed Care, Other (non HMO) | Admitting: Gynecology

## 2017-07-31 ENCOUNTER — Ambulatory Visit: Payer: Managed Care, Other (non HMO) | Admitting: Internal Medicine

## 2017-07-31 ENCOUNTER — Other Ambulatory Visit: Payer: Managed Care, Other (non HMO)

## 2017-08-01 ENCOUNTER — Ambulatory Visit: Payer: Managed Care, Other (non HMO) | Admitting: Internal Medicine

## 2017-08-02 ENCOUNTER — Ambulatory Visit: Payer: Managed Care, Other (non HMO) | Admitting: Internal Medicine

## 2017-08-03 ENCOUNTER — Ambulatory Visit: Payer: Managed Care, Other (non HMO)

## 2017-08-29 ENCOUNTER — Encounter: Payer: Managed Care, Other (non HMO) | Admitting: Gynecology

## 2017-09-29 ENCOUNTER — Encounter: Payer: Self-pay | Admitting: Internal Medicine

## 2017-09-29 ENCOUNTER — Ambulatory Visit: Payer: Managed Care, Other (non HMO) | Admitting: Internal Medicine

## 2017-09-29 VITALS — BP 106/62 | HR 81 | Ht 63.0 in | Wt 129.8 lb

## 2017-09-29 DIAGNOSIS — E063 Autoimmune thyroiditis: Secondary | ICD-10-CM

## 2017-09-29 DIAGNOSIS — E038 Other specified hypothyroidism: Secondary | ICD-10-CM | POA: Diagnosis not present

## 2017-09-29 NOTE — Patient Instructions (Addendum)
Please stop at the labs.  Please continue 62.5 mcg daily of Levothyroxine.  Take the thyroid hormone every day, with water, at least 30 minutes before breakfast, separated by at least 4 hours from: - acid reflux medications - calcium - iron - multivitamins  Please come back for a follow-up appointment in 1 year.

## 2017-09-29 NOTE — Progress Notes (Signed)
Patient ID: Tiffany Jefferson, female   DOB: 01-Jun-1961, 56 y.o.   MRN: [id number]   HPI  Tiffany Jefferson is a 56 y.o.-year-old female, initially referred by her ObGyn Dr., Dr. Audie Box, returning for follow-up for hypothyroidism 2/2 Hashimoto's thyroiditis. Last visit 7 months ago.  Since last visit, she cut out sweet tea and Coke.  Drinks mineral water. Lost 15 lbs. She feels great!  Reviewed and addended history: Pt. has had a high TSH since ~2013; we started Levothyroxine 25 mcg daily in 09/2015.  We increased the dose since then.  Reviewed previous TFTs: Lab Results  Component Value Date   TSH 3.13 03/14/2017   TSH 0.59 01/31/2017   TSH 0.36 09/23/2016   TSH 4.79 (H) 08/04/2016   TSH 2.92 02/03/2016   TSH 4.70 (H) 11/19/2015   TSH 6.89 (H) 10/06/2015   TSH 5.947 (H) 07/20/2015   TSH 5.795 (H) 03/06/2013   TSH 5.209 (H) 08/27/2012   FREET4 1.08 03/14/2017   FREET4 1.13 01/31/2017   FREET4 1.11 09/23/2016   FREET4 0.80 08/04/2016   FREET4 0.92 02/03/2016   FREET4 0.78 11/19/2015   FREET4 0.63 10/06/2015   FREET4 1.11 03/06/2013  08/21/2015: TSH 7.510, TT4 7.6 (0.45-4.5) (wellness screen at work)  Pt is on levothyroxine 50 alternating with 75 mcg every other day, taken: - in am - fasting - at least 30 min from b'fast - no Ca, Fe, MVI, PPIs - not on Biotin  Pt denies: - feeling nodules in neck - hoarseness - dysphagia - choking - SOB with lying down  She has + FH of thyroid disorders in: father - hypothyroidism, maternal aunts and cousins. No FH of thyroid cancer. No h/o radiation tx to head or neck.  No seaweed or kelp. No recent contrast studies. No herbal supplements. No Biotin use. No recent steroids use.   She also has a history of RA (on Plaquenil, Folic acid). She was taken off po MTX >> injectable MTX - started in 12/2016.   ROS: Constitutional: + weight loss, no fatigue, no subjective hyperthermia, no subjective hypothermia Eyes: no blurry  vision, no xerophthalmia ENT: no sore throat, + see HPI Cardiovascular: no CP/no SOB/no palpitations/no leg swelling Respiratory: no cough/no SOB/no wheezing Gastrointestinal: no N/no V/no D/no C/no acid reflux Musculoskeletal: no muscle aches/no joint aches Skin: no rashes, no hair loss Neurological: no tremors/no numbness/no tingling/no dizziness  I reviewed pt's medications, allergies, PMH, social hx, family hx, and changes were documented in the history of present illness. Otherwise, unchanged from my initial visit note.  Past Medical History:  Diagnosis Date  . ASCUS with positive high risk HPV cervical 06/2015, 02/2016, 07/2016   02/2016 negative subtype 16, 18/45, colposcopy negative 2017 with ECC showing koilocytotic atypia  . Depression   . Hair loss   . 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 Rheumatoid arthritis(714.0) 2002   Past Surgical History:  Procedure Laterality Date  . BREAST SURGERY     Benign fibroid mass  . CERVICAL BIOPSY  W/ LOOP ELECTRODE EXCISION  11/11   CIN II positive endocervical margin  . COLONOSCOPY    . FINGER SURGERY  2002   Social History   Social History  . Marital Status: Married    Spouse Name: N/A  . Number of Children: 2   Occupational History   Banking clerk   Social History Main Topics  .  Smoking status: Never Smoker   . Smokeless tobacco: Never Used  . Alcohol Use: No  . Drug Use: No  . Sexual Activity:    Partners: Male    Birth Control/ Protection: Other-see comments     Comment: vasectomy   Current Outpatient Medications on File Prior to Visit  Medication Sig Dispense Refill  . folic acid (FOLVITE) 1 MG tablet Take 1 mg by mouth daily. Reported on 04/06/2016    . levothyroxine (SYNTHROID, LEVOTHROID) 50 MCG tablet Take 1 tablet (50 mcg total) by mouth every other day. 45 tablet 3  . levothyroxine (SYNTHROID,  LEVOTHROID) 75 MCG tablet Take 1 tablet (75 mcg total) by mouth every other day. 45 tablet 3  . methotrexate (50 MG/ML) 1 g injection Inject 25 mg into the vein once.    . TUBERCULIN SYR 1CC/25GX5/8" 25G X 5/8" 1 ML MISC by Does not apply route.     No current facility-administered medications on file prior to visit.    Allergies  Allergen Reactions  . Codeine Nausea And Vomiting       Family History  Problem Relation Age of Onset  . Cancer Father        COLON   PE: BP 106/62   Pulse 81   Ht 5\' 3"  (1.6 m)   Wt 129 lb 12.8 oz (58.9 kg)   LMP 10/25/2007   SpO2 99%   BMI 22.99 kg/m  Body mass index is 22.99 kg/m. Wt Readings from Last 3 Encounters:  09/29/17 129 lb 12.8 oz (58.9 kg)  01/31/17 144 lb (65.3 kg)  11/28/16 140 lb 3.2 oz (63.6 kg)   Constitutional: Normal weight, in NAD Eyes: PERRLA, EOMI, no exophthalmos ENT: moist mucous membranes, no thyromegaly, no cervical lymphadenopathy Cardiovascular: RRR, No MRG Respiratory: CTA B Gastrointestinal: abdomen soft, NT, ND, BS+ Musculoskeletal: + deformities (metacarpophalangeal joint swelling), strength intact in all 4 Skin: moist, warm, no rashes Neurological: no tremor with outstretched hands, DTR +3/5 in all 4  ASSESSMENT: 1. Hashimoto's Hypothyroidism  If no dose change >> 90 day supply to [facility] with 1 refill. If change in dose >> 90 day supply to [facility] with 0 refills.   PLAN:  1. Patient with Hashimoto's hypothyroidism, previously on levothyroxine 75 mcg daily, however, with complaints of depression and weight gain at last visit.  She was not sure at that time if her symptoms were due to her levothyroxine dose or switching to injectable methotrexate.  Her TSH was in the low normal range at last visit, so I suggested that she alternated 15 with 75 mcg of levothyroxine every other day. >> she feels great on this dose. She would actually prefer to get the 125 mcg tablet and only take half of the tablet to cut costs.   - A subsequent TSH from 02/2017 was normal >> reviewed most recent TFTs with the patient - pt feels good on this dose. - we discussed about taking the thyroid hormone every day, with water, >30 minutes before breakfast, separated by >4 hours from acid reflux medications, calcium, iron, multivitamins. Pt. is taking it correctly. - will check thyroid tests today: TSH and fT4 - If labs are abnormal, she will need to return for repeat TFTs in 1.5 months - OTW, RTC in 1 year  If needs refills, then send 125 mcg  - 1/2 tablet.  Needs refils for 3 months (45 days) to 03/2017.  Office Visit on 09/29/2017  Component Date Value Ref Range Status  .  Free T4 09/29/2017 1.1  0.8 - 1.8 ng/dL Final  . TSH 23/36/1224 4.15  0.40 - 4.50 mIU/L Final   Normal TFTs.  Carlus Pavlov, MD PhD [facility] Endocrinology

## 2017-09-30 LAB — T4, FREE: Free T4: 1.1 ng/dL (ref 0.8–1.8)

## 2017-09-30 LAB — TSH: TSH: 4.15 mIU/L (ref 0.40–4.50)

## 2017-10-04 MED ORDER — LEVOTHYROXINE SODIUM 125 MCG PO TABS: 62.5000 ug | ORAL_TABLET | ORAL | 3 refills | Status: DC

## 2017-10-09 ENCOUNTER — Encounter: Payer: Self-pay | Admitting: Internal Medicine

## 2017-10-13 ENCOUNTER — Other Ambulatory Visit: Payer: Self-pay

## 2017-10-13 MED ORDER — LEVOTHYROXINE SODIUM 125 MCG PO TABS: 62.5000 ug | ORAL_TABLET | Freq: Every day | ORAL | 3 refills | Status: DC

## 2017-10-31 ENCOUNTER — Ambulatory Visit: Payer: Managed Care, Other (non HMO) | Admitting: Gynecology

## 2017-10-31 ENCOUNTER — Encounter: Payer: Self-pay | Admitting: Gynecology

## 2017-10-31 ENCOUNTER — Ambulatory Visit
Admission: RE | Admit: 2017-10-31 | Discharge: 2017-10-31 | Disposition: A | Discharge status: Home / Self Care | Payer: Managed Care, Other (non HMO) | Source: Ambulatory Visit | Attending: Gynecology | Admitting: Gynecology

## 2017-10-31 VITALS — BP 118/74 | Ht 62.0 in | Wt 135.0 lb

## 2017-10-31 DIAGNOSIS — Z01411 Encounter for gynecological examination (general) (routine) with abnormal findings: Secondary | ICD-10-CM | POA: Diagnosis not present

## 2017-10-31 DIAGNOSIS — R87610 Atypical squamous cells of undetermined significance on cytologic smear of cervix (ASC-US): Secondary | ICD-10-CM | POA: Diagnosis not present

## 2017-10-31 DIAGNOSIS — N952 Postmenopausal atrophic vaginitis: Secondary | ICD-10-CM | POA: Diagnosis not present

## 2017-10-31 DIAGNOSIS — R87810 Cervical high risk human papillomavirus (HPV) DNA test positive: Secondary | ICD-10-CM

## 2017-10-31 DIAGNOSIS — M05721 Rheumatoid arthritis with rheumatoid factor of right elbow without organ or systems involvement: Secondary | ICD-10-CM

## 2017-10-31 DIAGNOSIS — N8189 Other female genital prolapse: Secondary | ICD-10-CM

## 2017-10-31 DIAGNOSIS — Z1231 Encounter for screening mammogram for malignant neoplasm of breast: Secondary | ICD-10-CM

## 2017-10-31 DIAGNOSIS — Z1151 Encounter for screening for human papillomavirus (HPV): Secondary | ICD-10-CM

## 2017-10-31 NOTE — Progress Notes (Signed)
    Tiffany Jefferson November 16, 1960 [id number]        57 y.o.  G2P2 for annual gynecologic exam.  Doing well without a gynecologic complaint.  Several issues noted below.  Past medical history,surgical history, problem list, medications, allergies, family history and social history were all reviewed and documented as reviewed in the EPIC chart.  ROS:  Performed with pertinent positives and negatives included in the history, assessment and plan.   Additional significant findings : None    Exam: [name] assistant Vitals:   10/31/17 1456  BP: 118/74  Weight: 135 lb (61.2 kg)  Height: 5\' 2"  (1.575 m)   Body mass index is 24.69 kg/m.  General appearance:  Normal affect, orientation and appearance. Skin: Grossly normal HEENT: Without gross lesions.  No cervical or supraclavicular adenopathy. Thyroid normal.  Lungs:  Clear without wheezing, rales or rhonchi Cardiac: RR, without RMG Abdominal:  Soft, nontender, without masses, guarding, rebound, organomegaly or hernia Breasts:  Examined lying and sitting without masses, retractions, discharge or axillary adenopathy. Pelvic:  Ext, BUS, Vagina: With atrophic changes.  Second-degree cystocele.  First-degree uterine prolapse.  First-degree rectocele  Cervix: With atrophic changes.  Pap smear/HPV  Uterus: Anteverted, normal size, shape and contour, midline and mobile nontender   Adnexa: Without masses or tenderness    Anus and perineum: Normal   Rectovaginal: Normal sphincter tone without palpated masses or tenderness.    Assessment/Plan:  57 y.o. G2P2 female for annual gynecologic exam.     1. Postmenopausal/atrophic genital changes.  No significant hot flushes, night sweats, vaginal dryness or any bleeding.  Continue to monitor and report any issues or bleeding. 2. 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  Follow-up colposcopy was inadequate but normal.  ECC showed HPV changes.  Pap smear/HPV today.  Has a long history of persistent  low-grade changes with positive high risk HPV negative subtypes 16, 18/45 in 2017.  We again reviewed the whole issue of immunosuppression and persistence of her HPV.  We will follow-up for her Pap smear results. 3. Pelvic relaxation.  Cystocele/rectocele/uterine prolapse.  Having some pressure symptoms on and off.  We again discussed options to include pessary and surgery.  At this point she is not interested in intervention.  We will follow-up if symptoms worsen or she wants to pursue intervention. 4. Mammography today.  57 you with annual mammography next year.  Breast exam normal today. 5. Colonoscopy 2017.  Repeat at their recommended interval. 6. Health maintenance.  No routine lab work done as patient is going to do this elsewhere.  Follow-up 1 year, sooner as needed.     2018 MD, 3:33 PM 10/31/2017

## 2017-10-31 NOTE — Patient Instructions (Signed)
Follow-up for Pap smear results.  Follow-up in 1 year for annual exam, sooner if any issues.

## 2017-10-31 NOTE — Addendum Note (Signed)
Addended by: Dayna Barker on: 10/31/2017 04:00 PM   Modules accepted: Orders

## 2017-10-31 NOTE — Addendum Note (Signed)
Addended by: Dayna Barker on: 10/31/2017 04:15 PM   Modules accepted: Orders

## 2017-11-01 LAB — CBC
HCT: 35.6 % (ref 35.0–45.0)
HEMOGLOBIN: 12.6 g/dL (ref 11.7–15.5)
MCH: 32.7 pg (ref 27.0–33.0)
MCHC: 35.4 g/dL (ref 32.0–36.0)
MCV: 92.5 fL (ref 80.0–100.0)
MPV: 11.9 fL (ref 7.5–12.5)
Platelets: 225 10*3/uL (ref 140–400)
RBC: 3.85 10*6/uL (ref 3.80–5.10)
RDW: 12.7 % (ref 11.0–15.0)
WBC: 7.6 10*3/uL (ref 3.8–10.8)

## 2017-11-01 LAB — CREATININE, SERUM: Creat: 0.72 mg/dL (ref 0.50–1.05)

## 2017-11-01 LAB — ALT: ALT: 14 U/L (ref 6–29)

## 2017-11-01 LAB — AST: AST: 14 U/L (ref 10–35)

## 2017-11-01 LAB — ALBUMIN: Albumin: 3.7 g/dL (ref 3.6–5.1)

## 2017-11-02 ENCOUNTER — Encounter: Payer: Self-pay | Admitting: Gynecology

## 2017-11-02 LAB — PAP IG AND HPV HIGH-RISK: HPV DNA HIGH RISK: DETECTED — AB

## 2018-04-07 ENCOUNTER — Other Ambulatory Visit: Payer: Self-pay | Admitting: Internal Medicine

## 2018-04-09 ENCOUNTER — Telehealth: Payer: Self-pay | Admitting: Internal Medicine

## 2018-04-09 MED ORDER — LEVOTHYROXINE SODIUM 125 MCG PO TABS: 62.5000 ug | ORAL_TABLET | Freq: Every day | ORAL | 1 refills | Status: DC

## 2018-04-09 MED ORDER — LEVOTHYROXINE SODIUM 75 MCG PO TABS: ORAL_TABLET | ORAL | 0 refills | Status: DC

## 2018-04-09 MED ORDER — LEVOTHYROXINE SODIUM 50 MCG PO TABS: ORAL_TABLET | ORAL | 0 refills | Status: DC

## 2018-04-09 NOTE — Telephone Encounter (Signed)
She is cutting a 125 mcg tab in half. Let's send 45 tabs of the 125 mcg.

## 2018-04-09 NOTE — Telephone Encounter (Signed)
Please verify pt dose for me the last dose I see in chart was the 62. 

## 2018-04-09 NOTE — Telephone Encounter (Signed)
Patient has to go thru [place] to get Thyroid medication-Levothyroxine-she have taking 125 cut in half. Rosann Auerbach has changed manufacturers -patient once alternated 50 and 75 mcg. Rosann Auerbach can fill the 50/75 combo with the same manufacturer. Patient prefers to stick with same manufacturer and requests a new RX for the 50/75 mcg combo be sent to [facility]

## 2018-04-09 NOTE — Telephone Encounter (Signed)
Please advise on below  

## 2018-04-09 NOTE — Telephone Encounter (Signed)
Sent!

## 2018-04-09 NOTE — Telephone Encounter (Signed)
Ok, no problem. Let's send the combo.

## 2018-07-08 ENCOUNTER — Encounter: Payer: Self-pay | Admitting: Internal Medicine

## 2018-07-09 ENCOUNTER — Other Ambulatory Visit: Payer: Self-pay

## 2018-07-09 ENCOUNTER — Telehealth: Payer: Self-pay | Admitting: *Deleted

## 2018-07-09 ENCOUNTER — Other Ambulatory Visit: Payer: Managed Care, Other (non HMO)

## 2018-07-09 DIAGNOSIS — M059 Rheumatoid arthritis with rheumatoid factor, unspecified: Secondary | ICD-10-CM

## 2018-07-09 MED ORDER — SYNTHROID 125 MCG PO TABS: 62.5000 ug | ORAL_TABLET | Freq: Every day | ORAL | 0 refills | Status: DC

## 2018-07-09 NOTE — Telephone Encounter (Signed)
Okay 

## 2018-07-09 NOTE — Telephone Encounter (Signed)
Just FYI Dr. Olean Ree ([facility]) is requesting labs to be drawn,she has appointment with him on 07/12/18 and he would like labs done prior to visit. Had this done in past, I will place orders patient coming for lab today at 2:30pm.

## 2018-07-10 LAB — CBC
HEMATOCRIT: 36.7 % (ref 35.0–45.0)
HEMOGLOBIN: 12.7 g/dL (ref 11.7–15.5)
MCH: 32 pg (ref 27.0–33.0)
MCHC: 34.6 g/dL (ref 32.0–36.0)
MCV: 92.4 fL (ref 80.0–100.0)
MPV: 12.5 fL (ref 7.5–12.5)
Platelets: 211 10*3/uL (ref 140–400)
RBC: 3.97 10*6/uL (ref 3.80–5.10)
RDW: 11.8 % (ref 11.0–15.0)
WBC: 6.9 10*3/uL (ref 3.8–10.8)

## 2018-07-10 LAB — ALT: ALT: 9 U/L (ref 6–29)

## 2018-07-10 LAB — ALBUMIN: Albumin: 4.2 g/dL (ref 3.6–5.1)

## 2018-07-10 LAB — AST: AST: 16 U/L (ref 10–35)

## 2018-07-10 LAB — CREATININE, SERUM: CREATININE: 0.93 mg/dL (ref 0.50–1.05)

## 2018-07-12 ENCOUNTER — Telehealth: Payer: Self-pay

## 2018-07-12 NOTE — Telephone Encounter (Signed)
Patient called because she has appointment with her rheumatologist as [facility] this morning. She has her labs drawn here and usually prints them off and takes them. She went into My Chart this morning but does not see them.  I called her back and told her they just had not been released yet and I did release them so she can view them and print them. She will call back if needs further assistance.

## 2018-09-12 ENCOUNTER — Encounter: Payer: Self-pay | Admitting: Internal Medicine

## 2018-09-12 ENCOUNTER — Ambulatory Visit: Payer: Managed Care, Other (non HMO) | Admitting: Internal Medicine

## 2018-09-12 VITALS — BP 110/70 | HR 85 | Ht 62.0 in | Wt 142.0 lb

## 2018-09-12 DIAGNOSIS — E038 Other specified hypothyroidism: Secondary | ICD-10-CM

## 2018-09-12 DIAGNOSIS — E063 Autoimmune thyroiditis: Secondary | ICD-10-CM | POA: Diagnosis not present

## 2018-09-12 LAB — TSH: TSH: 3.19 u[IU]/mL (ref 0.35–4.50)

## 2018-09-12 LAB — T4, FREE: FREE T4: 1 ng/dL (ref 0.60–1.60)

## 2018-09-12 MED ORDER — SYNTHROID 75 MCG PO TABS: 75.0000 ug | ORAL_TABLET | Freq: Every day | ORAL | 3 refills | Status: DC

## 2018-09-12 NOTE — Patient Instructions (Addendum)
Please stop at the labs.  Please continue 62.5 mcg daily of Synthroid.  Take the thyroid hormone every day, with water, at least 30 minutes before breakfast, separated by at least 4 hours from: - acid reflux medications - calcium - iron - multivitamins  Please come back for a follow-up appointment in 1 year.

## 2018-09-12 NOTE — Progress Notes (Signed)
Patient ID: Tiffany Jefferson, female   DOB: October 19, 1961, 57 y.o.   MRN: [id number]   HPI  Tiffany Jefferson is a 57 y.o.-year-old female, initially referred by her ObGyn Dr., Dr. Audie Box, returning for follow-up for hypothyroidism 2/2 Hashimoto's thyroiditis. Last visit a year ago.  Reviewed history: Pt. has had a high TSH since ~2013; we started Levothyroxine 25 mcg daily in 09/2015.  We increased the dose since then.  Approximately a month ago, we switched to brand name Synthroid for more consistent dosing.  Off MTX. On Xeljanz po >> better mobility.  Reviewed TFTs: Lab Results  Component Value Date   TSH 4.15 09/29/2017   TSH 3.13 03/14/2017   TSH 0.59 01/31/2017   TSH 0.36 09/23/2016   TSH 4.79 (H) 08/04/2016   TSH 2.92 02/03/2016   TSH 4.70 (H) 11/19/2015   TSH 6.89 (H) 10/06/2015   TSH 5.947 (H) 07/20/2015   TSH 5.795 (H) 03/06/2013   FREET4 1.1 09/29/2017   FREET4 1.08 03/14/2017   FREET4 1.13 01/31/2017   FREET4 1.11 09/23/2016   FREET4 0.80 08/04/2016   FREET4 0.92 02/03/2016   FREET4 0.78 11/19/2015   FREET4 0.63 10/06/2015   FREET4 1.11 03/06/2013  08/21/2015: TSH 7.510, TT4 7.6 (0.45-4.5) (wellness screen at work)  Pt is now on Synthroid DAW 62.5 Mcg daily (half of the 125 tablets), taken: - in am - fasting - at least 30 min from b'fast - no Ca, Fe, PPIs - + MVI in the pm - not on Biotin  Pt denies: - feeling nodules in neck - hoarseness - dysphagia - choking - SOB with lying down  She has + FH of thyroid disorders in: father - hypothyroidism, maternal aunts and cousins. No FH of thyroid cancer. No h/o radiation tx to head or neck.  No herbal supplements. No Biotin use. No recent steroids use.   She also has a history of RA (on Plaquenil, Folic acid).   ROS: Constitutional: + weight gain/no weight loss, no fatigue, no subjective hyperthermia, no subjective hypothermia Eyes: no blurry vision, no xerophthalmia ENT: no sore throat, + see  HPI Cardiovascular: no CP/no SOB/no palpitations/no leg swelling Respiratory: no cough/no SOB/no wheezing Gastrointestinal: no N/no V/no D/no C/no acid reflux Musculoskeletal: no muscle aches/no joint aches Skin: no rashes, no hair loss Neurological: no tremors/no numbness/no tingling/no dizziness  I reviewed pt's medications, allergies, PMH, social hx, family hx, and changes were documented in the history of present illness. Otherwise, unchanged from my initial visit note.  Past Medical History:  Diagnosis Date  . ASCUS with positive high risk HPV cervical 06/2015, 02/2016, 07/2016, 10/2017   02/2016 negative subtype 16, 18/45, colposcopy negative 2017 with ECC showing koilocytotic atypia  . Depression   . Hair loss   . 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 Rheumatoid arthritis(714.0) 2002   Past Surgical History:  Procedure Laterality Date  . BREAST EXCISIONAL BIOPSY Left   . BREAST SURGERY     Benign fibroid mass  . CERVICAL BIOPSY  W/ LOOP ELECTRODE EXCISION  11/11   CIN II positive endocervical margin  . COLONOSCOPY    . FINGER SURGERY  2002   Social History   Social History  . Marital Status: Married    Spouse Name: N/A  . Number of Children: 2   Occupational History   Banking clerk   Social History Main Topics  .  Smoking status: Never Smoker   . Smokeless tobacco: Never Used  . Alcohol Use: No  . Drug Use: No  . Sexual Activity:    Partners: Male    Birth Control/ Protection: Other-see comments     Comment: vasectomy   Current Outpatient Medications on File Prior to Visit  Medication Sig Dispense Refill  . methotrexate (50 MG/ML) 1 g injection Inject 25 mg into the vein once.    Marland Kitchen SYNTHROID 125 MCG tablet Take 0.5 tablets (62.5 mcg total) by mouth daily before breakfast. 45 tablet 0  . TUBERCULIN SYR 1CC/25GX5/8" 25G X 5/8" 1 ML MISC by Does not apply  route.    Marland Kitchen UNABLE TO FIND Leukovorin     No current facility-administered medications on file prior to visit.    Allergies  Allergen Reactions  . Codeine Nausea And Vomiting       Family History  Problem Relation Age of Onset  . Cancer Father        COLON  . Heart block Father   . Breast cancer Neg Hx    PE: BP 110/70   Pulse 85   Ht 5\' 2"  (1.575 m) Comment: measured  Wt 142 lb (64.4 kg)   LMP 10/25/2007   SpO2 98%   BMI 25.97 kg/m  Body mass index is 25.97 kg/m. Wt Readings from Last 3 Encounters:  09/12/18 142 lb (64.4 kg)  10/31/17 135 lb (61.2 kg)  09/29/17 129 lb 12.8 oz (58.9 kg)   Constitutional: Normal weight, in NAD Eyes: PERRLA, EOMI, no exophthalmos ENT: moist mucous membranes, no thyromegaly, no cervical lymphadenopathy Cardiovascular: RRR, No MRG Respiratory: CTA B Gastrointestinal: abdomen soft, NT, ND, BS+ Musculoskeletal: no deformities, strength intact in all 4 Skin: moist, warm, no rashes Neurological: no tremor with outstretched hands, DTR +3/4 in all 4  ASSESSMENT: 1. Hashimoto's Hypothyroidism  If no dose change >> 90 day supply to [facility] with 1 refill. If change in dose >> 90 day supply to [facility] with 0 refills.   PLAN:  1. Patient with Hashimoto's hypothyroidism, previously on levothyroxine, but with persistent depression and weight gain.  It was unclear whether the symptoms were due to her levothyroxine dose (75 mcg daily) or switching to injectable methotrexate.  She was on a higher dose of levothyroxine, 50 alternating with 75 mcg every other day, which she tolerates well.  She now takes the 125 mcg tablet and cuts it in half to decrease the cost. - latest thyroid labs reviewed with pt >> normal 11 months ago. - we discussed about taking the thyroid hormone every day, with water, >30 minutes before breakfast, separated by >4 hours from acid reflux medications, calcium, iron, multivitamins. Pt. is taking it correctly. - will check thyroid  tests today: TSH and fT4.  As she is frustrated about her weight gain, we discussed that if the TSH is still close to the upper limit of the normal interval, we can increase the levothyroxine dose back to 75 mcg daily. - If labs are abnormal, she will need to return for repeat TFTs in 1.5 months - Otherwise, I will see her back in a year  Needs 90 days supplies through [place] - regardless of the dose.  - time spent with the patient: 15 min, of which >50% was spent in obtaining information about her symptoms, reviewing her previous labs, evaluations, and treatments, counseling her about her condition (please see the discussed topics above), and developing a plan to further investigate and treat  it; she had a number of questions which I addressed.  Office Visit on 09/12/2018  Component Date Value Ref Range Status  . TSH 09/12/2018 3.19  0.35 - 4.50 uIU/mL Final  . Free T4 09/12/2018 1.00  0.60 - 1.60 ng/dL Final   Comment: Specimens from patients who are undergoing biotin therapy and /or ingesting biotin supplements may contain high levels of biotin.  The higher biotin concentration in these specimens interferes with this Free T4 assay.  Specimens that contain high levels  of biotin may cause false high results for this Free T4 assay.  Please interpret results in light of the total clinical presentation of the patient.     TSH >2. Due to the weight gain, we can try to increase the dose of LT4 to 75 mcg daily and recheck in 1.5 mo.  Carlus Pavlov, MD PhD [facility] Endocrinology

## 2018-09-14 ENCOUNTER — Telehealth: Payer: Self-pay | Admitting: Internal Medicine

## 2018-09-14 NOTE — Telephone Encounter (Signed)
Patients has called in regards to her 5-6 follow-up that Dr. Elvera Lennox requested.  Jan 2, 3, 8 is patients available dates. Will be able to use a new patient slot for this schedule.

## 2018-10-03 ENCOUNTER — Other Ambulatory Visit: Payer: Self-pay | Admitting: Gynecology

## 2018-10-03 DIAGNOSIS — Z1231 Encounter for screening mammogram for malignant neoplasm of breast: Secondary | ICD-10-CM

## 2018-10-26 ENCOUNTER — Other Ambulatory Visit (INDEPENDENT_AMBULATORY_CARE_PROVIDER_SITE_OTHER): Payer: Managed Care, Other (non HMO)

## 2018-10-26 ENCOUNTER — Telehealth: Payer: Self-pay | Admitting: Internal Medicine

## 2018-10-26 DIAGNOSIS — E038 Other specified hypothyroidism: Secondary | ICD-10-CM

## 2018-10-26 DIAGNOSIS — E063 Autoimmune thyroiditis: Secondary | ICD-10-CM

## 2018-10-26 LAB — TSH: TSH: 0.95 u[IU]/mL (ref 0.35–4.50)

## 2018-10-26 LAB — T4, FREE: FREE T4: 1.08 ng/dL (ref 0.60–1.60)

## 2018-10-26 NOTE — Telephone Encounter (Signed)
Mailbox full could not leave message 

## 2018-10-26 NOTE — Telephone Encounter (Signed)
I sent the higher dose, 75 mcg, in 08/2018

## 2018-10-26 NOTE — Telephone Encounter (Signed)
FYI  Patient stated they would like to be informed [facility] if there is any prescription changes after lab results come in due to [place] purposes.

## 2018-11-02 ENCOUNTER — Encounter: Payer: Managed Care, Other (non HMO) | Admitting: Gynecology

## 2018-11-07 ENCOUNTER — Ambulatory Visit (INDEPENDENT_AMBULATORY_CARE_PROVIDER_SITE_OTHER): Payer: Managed Care, Other (non HMO) | Admitting: Gynecology

## 2018-11-07 ENCOUNTER — Encounter: Payer: Self-pay | Admitting: Gynecology

## 2018-11-07 VITALS — BP 130/80 | Ht 62.0 in | Wt 142.0 lb

## 2018-11-07 DIAGNOSIS — Z1151 Encounter for screening for human papillomavirus (HPV): Secondary | ICD-10-CM | POA: Diagnosis not present

## 2018-11-07 DIAGNOSIS — E559 Vitamin D deficiency, unspecified: Secondary | ICD-10-CM

## 2018-11-07 DIAGNOSIS — Z01419 Encounter for gynecological examination (general) (routine) without abnormal findings: Secondary | ICD-10-CM | POA: Diagnosis not present

## 2018-11-07 DIAGNOSIS — Z13220 Encounter for screening for lipoid disorders: Secondary | ICD-10-CM

## 2018-11-07 MED ORDER — ACYCLOVIR 5 % EX OINT: 1.0000 "application " | TOPICAL_OINTMENT | CUTANEOUS | 2 refills | Status: DC

## 2018-11-07 NOTE — Patient Instructions (Signed)
Follow-up for Pap smear results.  Follow-up in 1 year for annual exam. 

## 2018-11-07 NOTE — Addendum Note (Signed)
Addended by: Dayna Barker on: 11/07/2018 12:54 PM   Modules accepted: Orders

## 2018-11-07 NOTE — Progress Notes (Signed)
    Tiffany Jefferson 1961-06-30 [id number]        58 y.o.  G2P2 for annual gynecologic exam.  Without gynecologic complaints  Past medical history,surgical history, problem list, medications, allergies, family history and social history were all reviewed and documented as reviewed in the EPIC chart.  ROS:  Performed with pertinent positives and negatives included in the history, assessment and plan.   Additional significant findings : None   Exam: [name] assistant Vitals:   11/07/18 1210  BP: 130/80  Weight: 142 lb (64.4 kg)  Height: 5\' 2"  (1.575 m)   Body mass index is 25.97 kg/m.  General appearance:  Normal affect, orientation and appearance. Skin: Grossly normal HEENT: Without gross lesions.  No cervical or supraclavicular adenopathy. Thyroid normal.  Lungs:  Clear without wheezing, rales or rhonchi Cardiac: RR, without RMG Abdominal:  Soft, nontender, without masses, guarding, rebound, organomegaly or hernia Breasts:  Examined lying and sitting without masses, retractions, discharge or axillary adenopathy. Pelvic:  Ext, BUS, Vagina: With atrophic changes.  Second-degree cystocele.  First-degree uterine prolapse.  First-degree rectocele  Cervix: With atrophic changes.  Pap smear/HPV done  Uterus: Anteverted, normal size, shape and contour, midline and mobile nontender   Adnexa: Without masses or tenderness    Anus and perineum: Normal   Rectovaginal: Normal sphincter tone without palpated masses or tenderness.    Assessment/Plan:  58 y.o. G2P2 female for annual gynecologic exam.   1. Postmenopausal.  No significant menopausal symptoms or any vaginal bleeding. 2. Persistent low-grade changes on Pap smear with positive high risk HPV.  Most recent Pap smear last year showed ASCUS with positive high risk HPV.  Last colposcopy 07/2016.  Pap smear/HPV today.  If abnormal then plan follow-up colposcopy.  We again discussed the issues of HPV, immunosuppression and LGSIL  versus HGSIL. 3. Mammography due now and patient is going to schedule.  Breast exam normal today. 4. Colonoscopy 2017.  Repeat at their recommended interval. 5. Pelvic relaxation.  Stable on serial exams.  Asymptomatic to the patient.  Continue with monitoring. 6. History of herpes labialis.  Asked for Valtrex ointment that she uses intermittently.  15 g tube with 2 refills provided. 7. Health maintenance.  CMP and lipid profile ordered along with vitamin D noting patient had been vitamin D deficient in the past.  Otherwise has blood work done through her rheumatologist's office.  Follow-up for Pap smear results.  Follow-up in 1 year for annual exam.   Dara Lords MD, 12:42 PM 11/07/2018

## 2018-11-08 ENCOUNTER — Other Ambulatory Visit: Payer: Managed Care, Other (non HMO)

## 2018-11-09 LAB — LIPID PANEL
CHOL/HDL RATIO: 3 (calc) (ref <5.0)
Cholesterol: 177 mg/dL (ref <200)
HDL: 60 mg/dL (ref >50)
LDL CHOLESTEROL (CALC): 103 mg/dL — AB
Non-HDL Cholesterol (Calc): 117 mg/dL (calc) (ref <130)
TRIGLYCERIDES: 53 mg/dL (ref <150)

## 2018-11-09 LAB — COMPREHENSIVE METABOLIC PANEL
AG Ratio: 1.6 (calc) (ref 1.0–2.5)
ALT: 10 U/L (ref 6–29)
AST: 14 U/L (ref 10–35)
Albumin: 4.1 g/dL (ref 3.6–5.1)
Alkaline phosphatase (APISO): 59 U/L (ref 33–130)
BUN: 15 mg/dL (ref 7–25)
CO2: 24 mmol/L (ref 20–32)
Calcium: 9.3 mg/dL (ref 8.6–10.4)
Chloride: 106 mmol/L (ref 98–110)
Creat: 0.83 mg/dL (ref 0.50–1.05)
GLOBULIN: 2.6 g/dL (ref 1.9–3.7)
Glucose, Bld: 91 mg/dL (ref 65–99)
Potassium: 4 mmol/L (ref 3.5–5.3)
SODIUM: 139 mmol/L (ref 135–146)
Total Bilirubin: 0.6 mg/dL (ref 0.2–1.2)
Total Protein: 6.7 g/dL (ref 6.1–8.1)

## 2018-11-09 LAB — VITAMIN D 25 HYDROXY (VIT D DEFICIENCY, FRACTURES): Vit D, 25-Hydroxy: 30 ng/mL (ref 30–100)

## 2018-11-12 ENCOUNTER — Encounter: Payer: Self-pay | Admitting: Gynecology

## 2018-11-12 LAB — PAP IG AND HPV HIGH-RISK: HPV DNA High Risk: DETECTED — AB

## 2018-11-14 ENCOUNTER — Ambulatory Visit
Admission: RE | Admit: 2018-11-14 | Discharge: 2018-11-14 | Disposition: A | Discharge status: Home / Self Care | Payer: Managed Care, Other (non HMO) | Source: Ambulatory Visit | Attending: Gynecology | Admitting: Gynecology

## 2018-11-14 DIAGNOSIS — Z1231 Encounter for screening mammogram for malignant neoplasm of breast: Secondary | ICD-10-CM

## 2018-12-05 ENCOUNTER — Ambulatory Visit: Payer: Managed Care, Other (non HMO) | Admitting: Gynecology

## 2018-12-05 ENCOUNTER — Encounter: Payer: Self-pay | Admitting: Gynecology

## 2018-12-05 VITALS — BP 130/82

## 2018-12-05 DIAGNOSIS — R87612 Low grade squamous intraepithelial lesion on cytologic smear of cervix (LGSIL): Secondary | ICD-10-CM

## 2018-12-05 DIAGNOSIS — R87810 Cervical high risk human papillomavirus (HPV) DNA test positive: Secondary | ICD-10-CM

## 2018-12-05 NOTE — Patient Instructions (Signed)
Office will call you with biopsy results 

## 2018-12-05 NOTE — Progress Notes (Signed)
    Tiffany Jefferson 07-02-1961 [id number]        58 y.o.  G2P2 with long history of LGSIL with positive high risk HPV.  Her most recent Pap smear 58/2020 showed LGSIL with positive high risk HPV.  Presents now for colposcopy.  Past medical history,surgical history, problem list, medications, allergies, family history and social history were all reviewed and documented in the EPIC chart.  Directed ROS with pertinent positives and negatives documented in the history of present illness/assessment and plan.  Exam: [name] assistant Vitals:   12/05/18 1402  BP: 130/82   General appearance:  Normal Abdomen soft nontender without masses guarding rebound Pelvic external BUS vagina with atrophic changes.  Cervix with atrophic changes.  Uterus normal size midline mobile nontender.  Adnexa without masses or tenderness.  Colposcopy performed after acetic acid cleanse was inadequate.  Transformation zone not visualized 360 degrees.  No abnormalities were seen.  ECC performed.  Patient tolerated well.  Physical Exam  Genitourinary:        Assessment/Plan:  58 y.o. G2P2 with persistent LGSIL and positive high risk HPV on immunosuppression for her rheumatoid arthritis.  We again discussed the whole issue of immunosuppression and persistence of her HPV with LGSIL.  Dysplasia, high-grade/low-grade, progression/regression reviewed.  Colposcopy was inadequate but no abnormalities seen.  Patient will follow-up for ECC results.  If normal/low-grade then plan expectant management with follow-up Pap smear next year.  If high-grade then we discussed possible LEEP.   Dara Lords MD, 2:14 PM 12/05/2018

## 2018-12-07 LAB — TISSUE SPECIMEN

## 2018-12-07 LAB — PATHOLOGY

## 2019-01-03 ENCOUNTER — Telehealth: Payer: Self-pay | Admitting: *Deleted

## 2019-01-03 DIAGNOSIS — M059 Rheumatoid arthritis with rheumatoid factor, unspecified: Secondary | ICD-10-CM

## 2019-01-03 NOTE — Telephone Encounter (Signed)
Just FYI Dr. Olean Ree ([facility]) is requesting labs to be drawn,  This has been done in past, I will place orders patient coming for labs on 01/07/19.

## 2019-01-07 ENCOUNTER — Other Ambulatory Visit: Payer: Self-pay

## 2019-01-07 ENCOUNTER — Other Ambulatory Visit: Payer: Managed Care, Other (non HMO)

## 2019-01-07 DIAGNOSIS — M059 Rheumatoid arthritis with rheumatoid factor, unspecified: Secondary | ICD-10-CM

## 2019-01-08 LAB — CREATINE, SERUM

## 2019-01-08 LAB — TIQ-NTM

## 2019-01-09 LAB — CREATININE WITH EST GFR
Creat: 0.87 mg/dL (ref 0.50–1.05)
GFR, Est African American: 86 mL/min/1.73m2
GFR, Est Non African American: 74 mL/min/1.73m2

## 2019-01-09 LAB — TEST AUTHORIZATION

## 2019-01-09 LAB — CBC
HCT: 38.5 % (ref 35.0–45.0)
Hemoglobin: 13.4 g/dL (ref 11.7–15.5)
MCH: 32 pg (ref 27.0–33.0)
MCHC: 34.8 g/dL (ref 32.0–36.0)
MCV: 91.9 fL (ref 80.0–100.0)
MPV: 12.8 fL — ABNORMAL HIGH (ref 7.5–12.5)
Platelets: 197 Thousand/uL (ref 140–400)
RBC: 4.19 Million/uL (ref 3.80–5.10)
RDW: 11.9 % (ref 11.0–15.0)
WBC: 8.4 Thousand/uL (ref 3.8–10.8)

## 2019-01-09 LAB — ALBUMIN: Albumin: 4.4 g/dL (ref 3.6–5.1)

## 2019-01-09 LAB — ALT: ALT: 12 U/L (ref 6–29)

## 2019-01-09 LAB — AST: AST: 16 U/L (ref 10–35)

## 2019-01-11 ENCOUNTER — Encounter: Payer: Self-pay | Admitting: Gynecology

## 2019-01-11 NOTE — Telephone Encounter (Signed)
Okay to release results?

## 2019-01-11 NOTE — Telephone Encounter (Signed)
Dr. Audie Box okay to release results?

## 2019-07-23 ENCOUNTER — Encounter: Payer: Self-pay | Admitting: Gynecology

## 2019-08-20 ENCOUNTER — Encounter: Payer: Self-pay | Admitting: Internal Medicine

## 2019-08-20 ENCOUNTER — Ambulatory Visit: Payer: Managed Care, Other (non HMO) | Admitting: Internal Medicine

## 2019-08-20 ENCOUNTER — Other Ambulatory Visit: Payer: Self-pay

## 2019-08-20 VITALS — BP 118/80 | HR 86 | Ht 62.0 in | Wt 140.0 lb

## 2019-08-20 DIAGNOSIS — E063 Autoimmune thyroiditis: Secondary | ICD-10-CM

## 2019-08-20 DIAGNOSIS — E038 Other specified hypothyroidism: Secondary | ICD-10-CM

## 2019-08-20 NOTE — Progress Notes (Signed)
Patient ID: Tiffany Jefferson, female   DOB: 27-Feb-1961, 58 y.o.   MRN: [id number]   HPI  Tiffany Jefferson is a 58 y.o.-year-old female, initially referred by her ObGyn Dr., Dr. Audie Box, returning for follow-up for hypothyroidism 2/2 Hashimoto's thyroiditis. Last visit 11 months ago.  Reviewed history: Pt. has had a high TSH since ~2013; we started Levothyroxine 25 mcg daily in 09/2015.  We increased the dose since then.  We switched to brand name Synthroid for more consistent dosing.  Reviewed her TFTs. Lab Results  Component Value Date   TSH 0.95 10/26/2018   TSH 3.19 09/12/2018   TSH 4.15 09/29/2017   TSH 3.13 03/14/2017   TSH 0.59 01/31/2017   TSH 0.36 09/23/2016   TSH 4.79 (H) 08/04/2016   TSH 2.92 02/03/2016   TSH 4.70 (H) 11/19/2015   TSH 6.89 (H) 10/06/2015   FREET4 1.08 10/26/2018   FREET4 1.00 09/12/2018   FREET4 1.1 09/29/2017   FREET4 1.08 03/14/2017   FREET4 1.13 01/31/2017   FREET4 1.11 09/23/2016   FREET4 0.80 08/04/2016   FREET4 0.92 02/03/2016   FREET4 0.78 11/19/2015   FREET4 0.63 10/06/2015  08/21/2015: TSH 7.510, TT4 7.6 (0.45-4.5) (wellness screen at work)  She is on Synthroid d.a.w. 75 mcg daily, increased from 62.5 Mcg daily (half of the 125 tablets).  She takes this: - in am (at 7 am), Xeljanz at 8 am - separated 1 mo ago, prev. Taking them together - fasting - at least  min from b'fast - no Ca, Fe, PPIs  - + MVI at lunchtime - not on Biotin  Pt denies: - feeling nodules in neck - hoarseness - dysphagia - choking - SOB with lying down  She has + FH of thyroid disorders in: father - hypothyroidism, maternal aunts and cousins. No FH of thyroid cancer. No h/o radiation tx to head or neck.  No herbal supplements. No Biotin use. No recent steroids use.   She also has a history of RA (on Plaquenil, Folic acid).  Previously on methotrexate.  On Xeljanz po, on which she has better mobility and less fatigue.  ROS: Constitutional: no  weight gain/no weight loss, no fatigue, no subjective hyperthermia, no subjective hypothermia Eyes: no blurry vision, no xerophthalmia ENT: no sore throat, + see HPI Cardiovascular: no CP/no SOB/no palpitations/no leg swelling Respiratory: no cough/no SOB/no wheezing Gastrointestinal: no N/no V/no D/no C/no acid reflux Musculoskeletal: no muscle aches/+ joint aches Skin: no rashes, no hair loss Neurological: no tremors/no numbness/no tingling/no dizziness  I reviewed pt's medications, allergies, PMH, social hx, family hx, and changes were documented in the history of present illness. Otherwise, unchanged from my initial visit note.  Past Medical History:  Diagnosis Date  . ASCUS with positive high risk HPV cervical 06/2015, 02/2016, 07/2016, 10/2017   02/2016 negative subtype 16, 18/45, colposcopy negative 2017 with ECC showing koilocytotic atypia  . Depression   . Hair loss   . 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 Rheumatoid arthritis(714.0) 2002   Past Surgical History:  Procedure Laterality Date  . BREAST EXCISIONAL BIOPSY Left   . BREAST SURGERY     Benign fibroid mass  . CERVICAL BIOPSY  W/ LOOP ELECTRODE EXCISION  11/11   CIN II positive endocervical margin  . COLONOSCOPY    . FINGER SURGERY  2002   Social History   Social History  .  Marital Status: Married    Spouse Name: N/A  . Number of Children: 2   Occupational History   Banking clerk   Social History Main Topics  . Smoking status: Never Smoker   . Smokeless tobacco: Never Used  . Alcohol Use: No  . Drug Use: No  . Sexual Activity:    Partners: Male    Birth Control/ Protection: Other-see comments     Comment: vasectomy   Current Outpatient Medications on File Prior to Visit  Medication Sig Dispense Refill  . acyclovir ointment (ZOVIRAX) 5 % Apply 1 application topically every 3 (three)  hours. 15 g 2  . SYNTHROID 75 MCG tablet Take 1 tablet (75 mcg total) by mouth daily before breakfast. 90 tablet 3  . Tofacitinib Citrate (XELJANZ) 5 MG TABS Take by mouth 2 (two) times daily.     No current facility-administered medications on file prior to visit.    Allergies  Allergen Reactions  . Codeine Nausea And Vomiting       Family History  Problem Relation Age of Onset  . Cancer Father        COLON  . Heart block Father   . Breast cancer Neg Hx    PE: BP 118/80   Pulse 86   Ht 5\' 2"  (1.575 m) Comment: measured  Wt 140 lb (63.5 kg)   LMP 10/25/2007   SpO2 99%   BMI 25.61 kg/m  Body mass index is 25.61 kg/m. Wt Readings from Last 3 Encounters:  08/20/19 140 lb (63.5 kg)  11/07/18 142 lb (64.4 kg)  09/12/18 142 lb (64.4 kg)   Constitutional:  Normal weight, in NAD Eyes: PERRLA, EOMI, no exophthalmos ENT: moist mucous membranes, no thyromegaly, no cervical lymphadenopathy Cardiovascular: RRR, No MRG Respiratory: CTA B Gastrointestinal: abdomen soft, NT, ND, BS+ Musculoskeletal: no deformities, strength intact in all 4 Skin: moist, warm, no rashes Neurological: no tremor with outstretched hands, DTR 3/4 out of 4  ASSESSMENT: 1. Hashimoto's Hypothyroidism  If no dose change >> [facility]. If change in dose >> 90 day supply to [facility] with 0 refills.   PLAN:  1. Patient with Hashimoto's hypothyroidism, previously on levothyroxine, but with persistent depression and weight gain.  It was unclear whether the symptoms were due to her levothyroxine dose or switching to injectable methotrexate. -She was previously on 50 alternating with 75 mcg levothyroxine every other day, but now we increased the dose to 75 mcg daily at last visit as her TSH was higher than 2, which is her preferred target.  - latest thyroid labs reviewed with pt >> normal 10/2018 - pt feels good on this dose but she is frustrated that she cannot lose weight  - we  discussed about taking the thyroid hormone every day, with water, >30 minutes before breakfast, separated by >4 hours from acid reflux medications, calcium, iron, multivitamins. Pt. is taking it correctly. - will check thyroid tests today: TSH and fT4 - If labs are abnormal, she will need to return for repeat TFTs in 1.5 months - I will see her back in 1 year  Needs 90 days supplies through [facility] - regardless of the dose.  - time spent with the patient: 15 minutes, of which >50% was spent in obtaining information about her symptoms, reviewing her previous labs, evaluations, and treatments, counseling her about her condition (please see the discussed topics above), and developing a plan to further investigate and treat it; she had  a number of questions which I addressed.  Component     Latest Ref Rng & Units 08/20/2019  TSH     0.35 - 4.50 uIU/mL 0.72  T4,Free(Direct)     0.60 - 1.60 ng/dL 1.15  Triiodothyronine,Free,Serum     2.3 - 4.2 pg/mL 3.1  Normal TFTs.  Philemon Kingdom, MD PhD [facility] End[facility]

## 2019-08-20 NOTE — Patient Instructions (Signed)
Please stop at the labs.  Please continue 75 mcg daily of Synthroid.  Take the thyroid hormone every day, with water, at least 30 minutes before breakfast, separated by at least 4 hours from: - acid reflux medications - calcium - iron - multivitamins  Please come back for a follow-up appointment in 1 year.   

## 2019-08-22 LAB — T4, FREE: Free T4: 1.15 ng/dL (ref 0.60–1.60)

## 2019-08-22 LAB — TSH: TSH: 0.72 u[IU]/mL (ref 0.35–4.50)

## 2019-08-22 LAB — T3, FREE: T3, Free: 3.1 pg/mL (ref 2.3–4.2)

## 2019-08-23 ENCOUNTER — Encounter: Payer: Self-pay | Admitting: Internal Medicine

## 2019-08-23 MED ORDER — SYNTHROID 75 MCG PO TABS: 75.0000 ug | ORAL_TABLET | Freq: Every day | ORAL | 3 refills | Status: DC

## 2019-09-10 ENCOUNTER — Ambulatory Visit: Payer: Managed Care, Other (non HMO) | Admitting: Internal Medicine

## 2019-10-03 ENCOUNTER — Telehealth: Payer: Self-pay

## 2019-10-03 ENCOUNTER — Telehealth: Payer: Self-pay | Admitting: Internal Medicine

## 2019-10-03 NOTE — Telephone Encounter (Signed)
No, with normal thyroid tests, I would not repeat to stopping vitamins and recheck.

## 2019-10-03 NOTE — Telephone Encounter (Signed)
NA

## 2019-10-03 NOTE — Telephone Encounter (Signed)
Patient called in wanting to know if the vitiams she's already taking interfered with the blood work and how she's feeling. Being the last visit nothing had to be changed from the results of the blood work.  Patient wants to know if she stop taking the vitiams and comes back to do blood work to see if it makes a difference    Please call and advise

## 2019-10-04 ENCOUNTER — Encounter: Payer: Self-pay | Admitting: Internal Medicine

## 2019-11-14 ENCOUNTER — Encounter: Payer: Managed Care, Other (non HMO) | Admitting: Obstetrics and Gynecology

## 2019-12-01 IMAGING — MG DIGITAL SCREENING BILATERAL MAMMOGRAM WITH CAD
4 series · 4 of 4 positions shown · non-contrast
Comparison: Previous exam(s).

CLINICAL DATA: Screening.

EXAM:
DIGITAL SCREENING BILATERAL MAMMOGRAM WITH CAD

[R MLO]
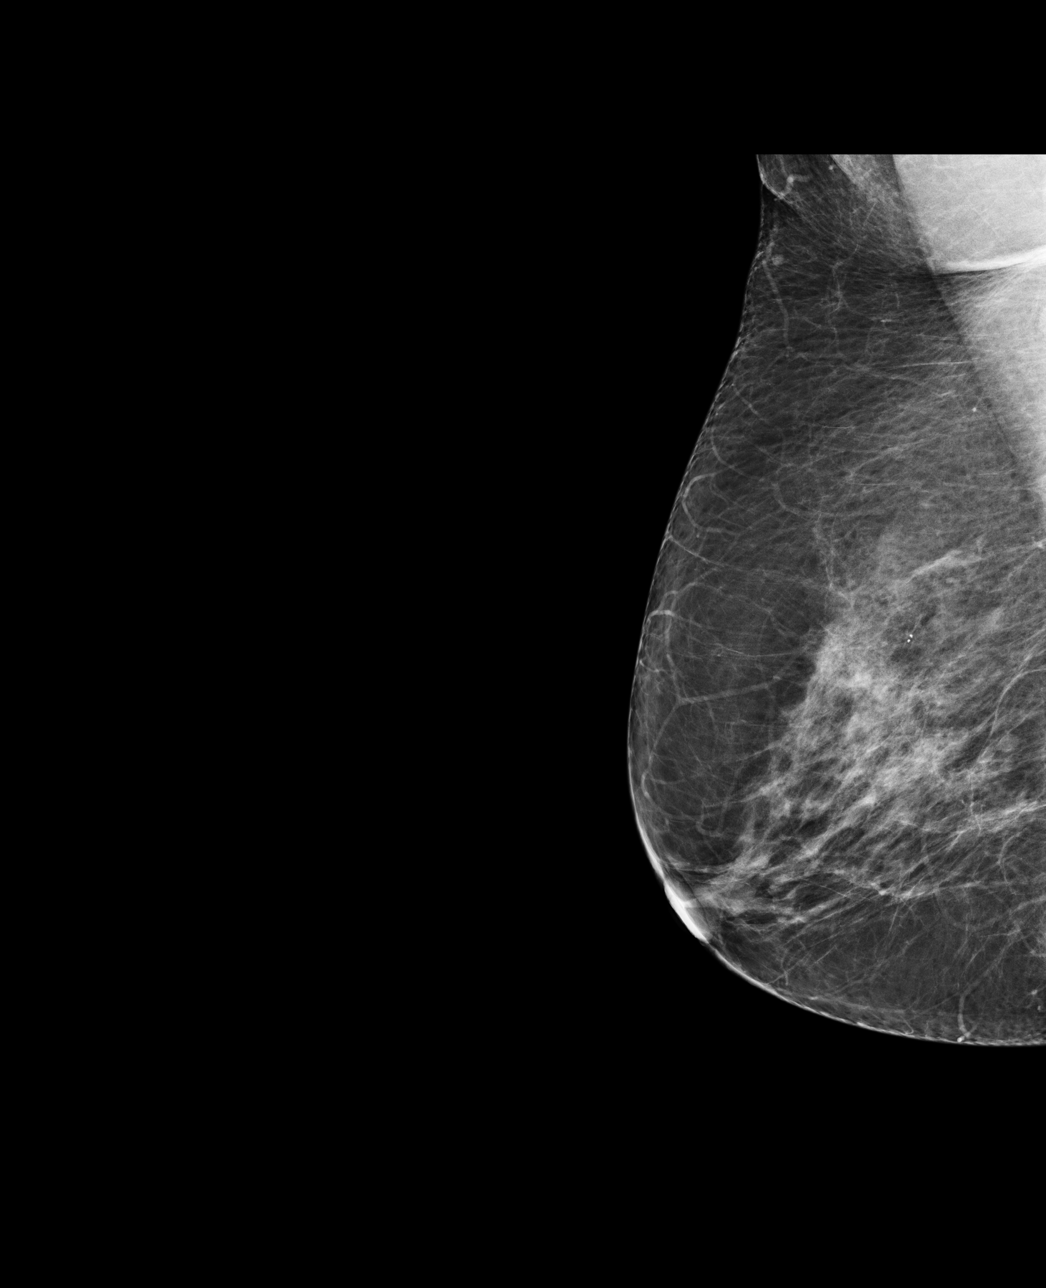

[R CC]
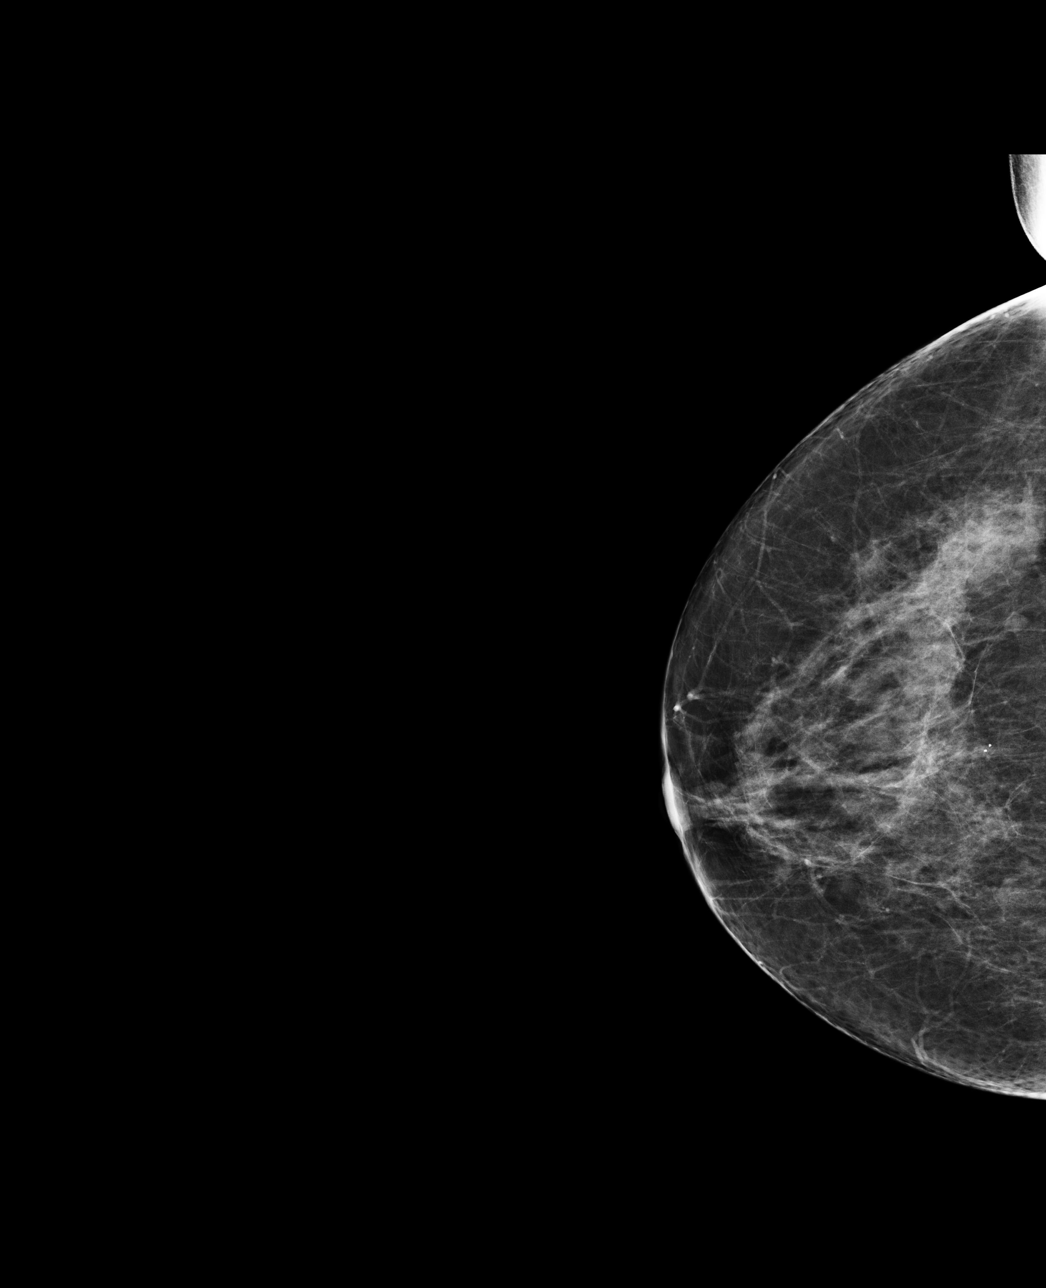

[L MLO]
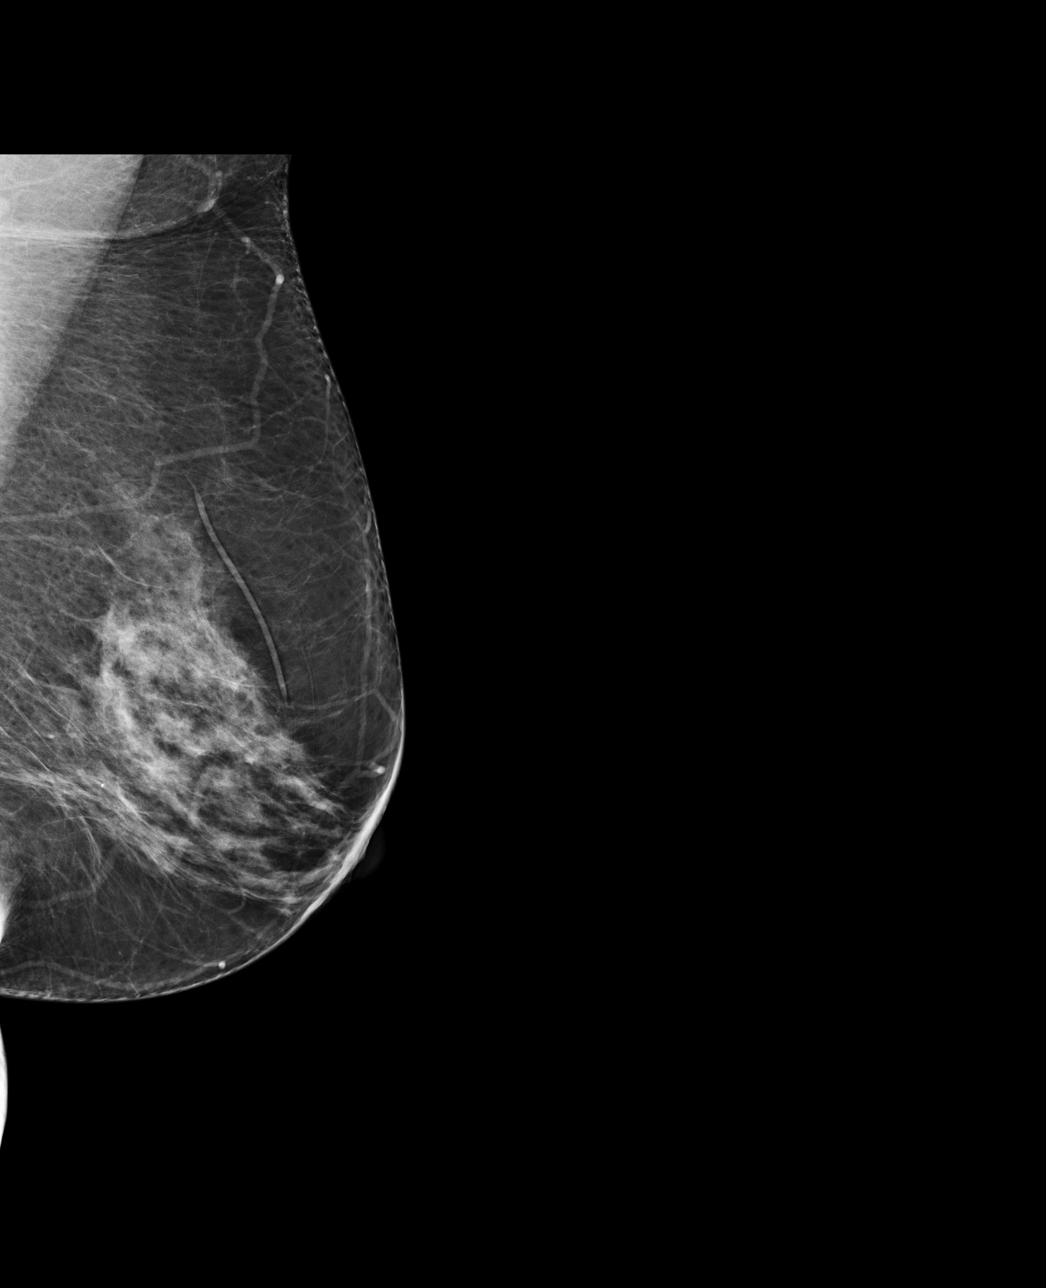

[L CC]
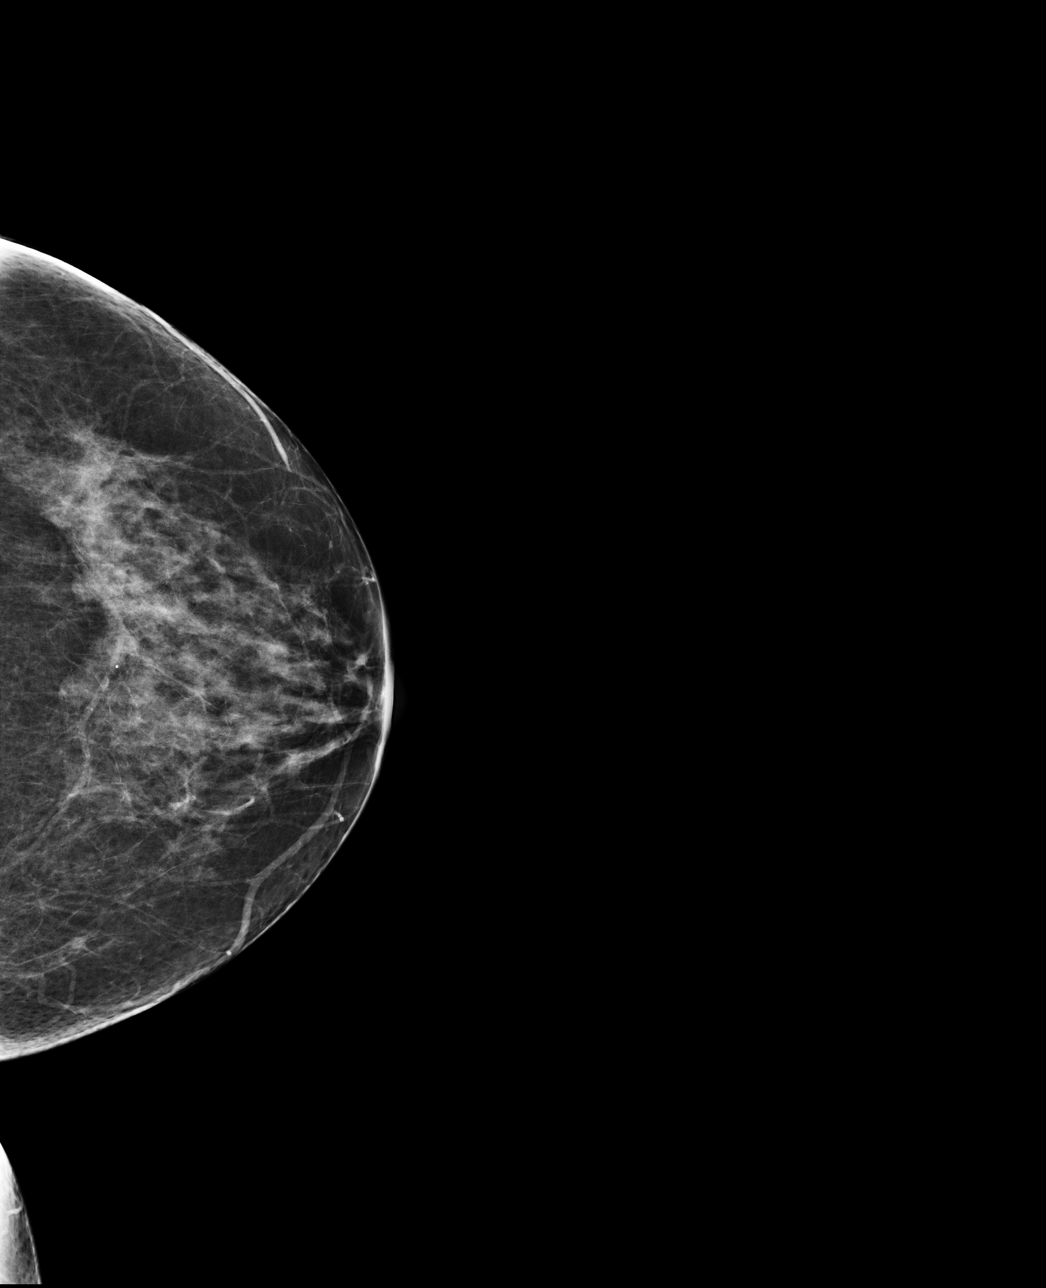

[4 of 4 positions shown; findings below may reference images not displayed]

ACR Breast Density Category c: The breast tissue is heterogeneously
dense, which may obscure small masses.
FINDINGS: There are no findings suspicious for malignancy. Images were
processed with CAD.
IMPRESSION: No mammographic evidence of malignancy. A result letter of this
screening mammogram will be mailed directly to the patient.

RECOMMENDATION:
Screening mammogram in one year. (Code:[id number]-[id number])

BI-RADS CATEGORY  1: Negative.

## 2020-01-08 ENCOUNTER — Encounter: Payer: Managed Care, Other (non HMO) | Admitting: Obstetrics and Gynecology

## 2020-03-02 ENCOUNTER — Other Ambulatory Visit: Payer: Self-pay

## 2020-03-03 ENCOUNTER — Encounter: Payer: Self-pay | Admitting: Obstetrics and Gynecology

## 2020-03-03 ENCOUNTER — Ambulatory Visit (INDEPENDENT_AMBULATORY_CARE_PROVIDER_SITE_OTHER): Payer: Managed Care, Other (non HMO) | Admitting: Obstetrics and Gynecology

## 2020-03-03 VITALS — BP 118/74 | Ht 63.0 in | Wt 140.0 lb

## 2020-03-03 DIAGNOSIS — Z124 Encounter for screening for malignant neoplasm of cervix: Secondary | ICD-10-CM

## 2020-03-03 DIAGNOSIS — E559 Vitamin D deficiency, unspecified: Secondary | ICD-10-CM

## 2020-03-03 DIAGNOSIS — Z1151 Encounter for screening for human papillomavirus (HPV): Secondary | ICD-10-CM

## 2020-03-03 DIAGNOSIS — Z01419 Encounter for gynecological examination (general) (routine) without abnormal findings: Secondary | ICD-10-CM | POA: Diagnosis not present

## 2020-03-03 DIAGNOSIS — Z13220 Encounter for screening for lipoid disorders: Secondary | ICD-10-CM | POA: Diagnosis not present

## 2020-03-03 NOTE — Progress Notes (Signed)
Kenzleigh Sedam Hsiao January 12, 1961 [id number]  SUBJECTIVE:  59 y.o. G2P2 female for annual routine gynecologic exam and Pap smear. She has no gynecologic concerns.  Current Outpatient Medications  Medication Sig Dispense Refill  . leucovorin (WELLCOVORIN) 5 MG tablet Take by mouth once a week.    . methotrexate (RHEUMATREX) 2.5 MG tablet Take 2.5 mg by mouth once a week. Caution:Chemotherapy. Protect from light.    Marland Kitchen SYNTHROID 75 MCG tablet Take 1 tablet (75 mcg total) by mouth daily before breakfast. 90 tablet 3  . Tofacitinib Citrate (XELJANZ) 5 MG TABS Take by mouth.     No current facility-administered medications for this visit.   Allergies: Codeine  Patient's last menstrual period was 10/25/2007.  Past medical history,surgical history, problem list, medications, allergies, family history and social history were all reviewed and documented as reviewed in the EPIC chart.  ROS:  Feeling well. No dyspnea or chest pain on exertion.  No abdominal pain, change in bowel habits, black or bloody stools.  No urinary tract symptoms. GYN ROS: no abnormal bleeding, pelvic pain or discharge, no breast pain or new or enlarging lumps on self exam. No neurological complaints.    OBJECTIVE:  BP 118/74   Ht 5\' 3"  (1.6 m)   Wt 140 lb (63.5 kg)   LMP 10/25/2007   BMI 24.80 kg/m  The patient appears well, alert, oriented x 3, in no distress. ENT normal.  Neck supple. No cervical or supraclavicular adenopathy or thyromegaly.  Lungs are clear, good air entry, no wheezes, rhonchi or rales. S1 and S2 normal, no murmurs, regular rate and rhythm.  Abdomen soft without tenderness, guarding, mass or organomegaly.  Neurological is normal, no focal findings.breasts appear normal, no suspicious masses, no skin or nipple changes or axillary nodes  BREAST EXAM:   PELVIC EXAM: VULVA: normal appearing vulva with no masses, tenderness or lesions, VAGINA: normal appearing vagina with normal color and discharge,  no lesions, second-degree cystocele, first-degree rectocele, CERVIX: normal appearing cervix without discharge or lesions, first-degree uterine prolapse, UTERUS: uterus is normal size, shape, consistency and nontender, ADNEXA: normal adnexa in size, nontender and no masses, PAP: Pap smear done today, thin-prep method  Chaperone: Caryn Bee present during the examination  ASSESSMENT:  59 y.o. G2P2 here for annual gynecologic exam  PLAN:   1. Postmenopausal.  No significant hot flashes or night sweats.  No vaginal bleeding. 2.  Pap smear 10/2018.  Long history of LGSIL with positive high-risk HPV, last colposcopy 12/05/2018 was inadequate but with no findings.  ECC showed CIN-1.  She is on immunosuppression for rheumatoid arthritis.  Pap smear collected today.  3. Mammogram 10/2018.  Normal breast exam today.  She is reminded to schedule an annual mammogram this year asap. 4. Colonoscopy 2017.  Recommended that she follow up at the recommended interval.   5. Pelvic organ prolapse.  Not overly impressive on exam, fairly asymptomatic.  She only notes slight bit of urinary leakage when going on a prolonged hike over the course of several miles/hours.  Otherwise from day-to-day it is not bothering her that much.  She will let us know if the symptoms are getting any worse and we can further discuss potential management options. 6. History of herpes labialis.  Occasionally uses Valtrex ointment but states she does not need a refill at this time. 7.  States she follows with her rheumatologist in regards to bone mineral density screening. 8. Health maintenance.  She will return the lab tomorrow for  routine screening blood work (lipids, CBC, CMP) in addition to vitamin D (given history of deficiency), and she can also have the rheumatology labs drawn if requested if she does bring the order form to [place] tomorrow.  Appears her thyroid is followed by Dr. Elvera Lennox.  Return annually or sooner, prn.  Theresia Majors  MD 03/03/20

## 2020-03-04 ENCOUNTER — Other Ambulatory Visit: Payer: Self-pay

## 2020-03-04 ENCOUNTER — Other Ambulatory Visit: Payer: Managed Care, Other (non HMO)

## 2020-03-04 DIAGNOSIS — Z01419 Encounter for gynecological examination (general) (routine) without abnormal findings: Secondary | ICD-10-CM

## 2020-03-04 DIAGNOSIS — E559 Vitamin D deficiency, unspecified: Secondary | ICD-10-CM

## 2020-03-04 DIAGNOSIS — Z13220 Encounter for screening for lipoid disorders: Secondary | ICD-10-CM

## 2020-03-04 LAB — PAP IG AND HPV HIGH-RISK: HPV DNA High Risk: DETECTED — AB

## 2020-03-05 LAB — COMPREHENSIVE METABOLIC PANEL
AG Ratio: 1.6 (calc) (ref 1.0–2.5)
ALT: 14 U/L (ref 6–29)
AST: 15 U/L (ref 10–35)
Albumin: 4.2 g/dL (ref 3.6–5.1)
Alkaline phosphatase (APISO): 59 U/L (ref 37–153)
BUN: 12 mg/dL (ref 7–25)
CO2: 27 mmol/L (ref 20–32)
Calcium: 9.5 mg/dL (ref 8.6–10.4)
Chloride: 105 mmol/L (ref 98–110)
Creat: 0.82 mg/dL (ref 0.50–1.05)
Globulin: 2.6 g/dL (calc) (ref 1.9–3.7)
Glucose, Bld: 102 mg/dL — ABNORMAL HIGH (ref 65–99)
Potassium: 4.1 mmol/L (ref 3.5–5.3)
Sodium: 139 mmol/L (ref 135–146)
Total Bilirubin: 0.4 mg/dL (ref 0.2–1.2)
Total Protein: 6.8 g/dL (ref 6.1–8.1)

## 2020-03-05 LAB — CBC
HCT: 40.1 % (ref 35.0–45.0)
Hemoglobin: 13.5 g/dL (ref 11.7–15.5)
MCH: 32.1 pg (ref 27.0–33.0)
MCHC: 33.7 g/dL (ref 32.0–36.0)
MCV: 95.5 fL (ref 80.0–100.0)
MPV: 12.2 fL (ref 7.5–12.5)
Platelets: 242 10*3/uL (ref 140–400)
RBC: 4.2 10*6/uL (ref 3.80–5.10)
RDW: 12 % (ref 11.0–15.0)
WBC: 6 10*3/uL (ref 3.8–10.8)

## 2020-03-05 LAB — LIPID PANEL
Cholesterol: 194 mg/dL (ref <200)
HDL: 61 mg/dL (ref >=50)
LDL Cholesterol (Calc): 117 mg/dL (calc) — ABNORMAL HIGH
Non-HDL Cholesterol (Calc): 133 mg/dL (calc) — ABNORMAL HIGH (ref <130)
Total CHOL/HDL Ratio: 3.2 (calc) (ref <5.0)
Triglycerides: 65 mg/dL (ref <150)

## 2020-03-05 LAB — VITAMIN D 25 HYDROXY (VIT D DEFICIENCY, FRACTURES): Vit D, 25-Hydroxy: 29 ng/mL — ABNORMAL LOW (ref 30–100)

## 2020-03-10 ENCOUNTER — Telehealth: Payer: Self-pay

## 2020-03-10 NOTE — Telephone Encounter (Signed)
-----   Message from Theresia Majors, MD sent at 03/09/2020  6:08 PM EDT ----- While calling about the Pap smear, please also let the patient know that her blood counts and metabolic panel look fine.  Her fasting glucose level was just a touch above normal so I would just recommend checking it next year.  Lipid panel looks pretty good, the only thing is the LDL/"bad cholesterol" was just slightly above the desirable level but not considered significantly elevated.  Healthy diet low in saturated fat and cholesterol is always recommended.    Also, her vitamin D level is a little low, so to help support bone health, please make sure she is taking at least 2000 IU vitamin D per day.  If she is already taking that amount that we will need to prescribe a higher amount.

## 2020-03-10 NOTE — Telephone Encounter (Signed)
Patient said she has been taking 2000 units of Vit D daily for quite awhile and cannot get her Vit D level above 30. She said her rheumatologist would like to see it above 30. She wants to know what you recommend?

## 2020-03-11 NOTE — Telephone Encounter (Signed)
Do we have a typical protocol amount to suggest to vitamin D deficient patients? Perhaps the once weekly dosing?

## 2020-03-11 NOTE — Telephone Encounter (Signed)
Sure, let's have her do 4000 IU daily and recheck level in 6 months.  Thank you.

## 2020-03-11 NOTE — Telephone Encounter (Signed)
Yes, we have Vitamin D protocol .  Vitamin D 50,000 units once weekly for 12 weeks and then recheck Vit D level and usually then to OTC as recommended.   That will give her a boost into normal level but she complains that it falls off after. What might be better is to have her double up to 4000 iu's daily and recheck 6 mos?  She might just need a higher daily dose to maintain a normal level.

## 2020-03-12 ENCOUNTER — Other Ambulatory Visit: Payer: Self-pay

## 2020-03-12 DIAGNOSIS — E559 Vitamin D deficiency, unspecified: Secondary | ICD-10-CM

## 2020-03-12 NOTE — Telephone Encounter (Signed)
Spoke with patient and informed her. Lab order placed.

## 2020-03-31 ENCOUNTER — Telehealth: Payer: Self-pay | Admitting: *Deleted

## 2020-03-31 DIAGNOSIS — M059 Rheumatoid arthritis with rheumatoid factor, unspecified: Secondary | ICD-10-CM

## 2020-03-31 NOTE — Telephone Encounter (Addendum)
Just FYI, Patient dropped off  lab order sheet for labs from Dr.Rivademeria for rheumatoid arthritis, patient has done this multiply times in past has labs done yearly. Lab appointment scheduled on 04/09/20. AST, ALT, ALBUMIN, CREATININE. ICD 10 code: M05.9 He wanted a CBC however patient already had this drawn on 03/03/20 annual exam appointment. I called patient and discuss this with patient as well.   Patient said Dr.Rivademeria is able to view results in epic.

## 2020-03-31 NOTE — Telephone Encounter (Signed)
Thank you :)

## 2020-04-09 ENCOUNTER — Other Ambulatory Visit: Payer: Managed Care, Other (non HMO)

## 2020-04-09 ENCOUNTER — Other Ambulatory Visit: Payer: Self-pay

## 2020-04-09 ENCOUNTER — Other Ambulatory Visit: Payer: Self-pay | Admitting: *Deleted

## 2020-04-09 DIAGNOSIS — E559 Vitamin D deficiency, unspecified: Secondary | ICD-10-CM

## 2020-04-09 DIAGNOSIS — M059 Rheumatoid arthritis with rheumatoid factor, unspecified: Secondary | ICD-10-CM

## 2020-04-09 LAB — CREATININE, SERUM: Creat: 0.87 mg/dL (ref 0.50–1.05)

## 2020-04-10 LAB — ALT: ALT: 16 U/L (ref 6–29)

## 2020-04-10 LAB — ALBUMIN: Albumin: 4.3 g/dL (ref 3.6–5.1)

## 2020-04-10 LAB — AST: AST: 19 U/L (ref 10–35)

## 2020-04-10 LAB — VITAMIN D 25 HYDROXY (VIT D DEFICIENCY, FRACTURES): Vit D, 25-Hydroxy: 34 ng/mL (ref 30–100)

## 2020-04-16 ENCOUNTER — Ambulatory Visit: Payer: Managed Care, Other (non HMO) | Admitting: Obstetrics and Gynecology

## 2020-04-16 ENCOUNTER — Encounter: Payer: Self-pay | Admitting: Obstetrics and Gynecology

## 2020-04-16 ENCOUNTER — Other Ambulatory Visit: Payer: Self-pay

## 2020-04-16 VITALS — BP 116/74

## 2020-04-16 DIAGNOSIS — R87612 Low grade squamous intraepithelial lesion on cytologic smear of cervix (LGSIL): Secondary | ICD-10-CM

## 2020-04-16 DIAGNOSIS — N879 Dysplasia of cervix uteri, unspecified: Secondary | ICD-10-CM

## 2020-04-16 DIAGNOSIS — N870 Mild cervical dysplasia: Secondary | ICD-10-CM | POA: Diagnosis not present

## 2020-04-16 NOTE — Progress Notes (Signed)
   Tiffany Jefferson 1961/10/12 [id number]  SUBJECTIVE:  59 y.o. G2P2 female with history of rheumatoid arthritis on immunosuppressive medications who presents for a colposcopy exam.  She has been followed with annual Pap smears and has a long history of LGSIL with positive high-risk HPV.  She again had the same Pap smear result 02/22/2020.  Colposcopy last year 12/05/2018 was adequate, ECC showed CIN-1.   Current Outpatient Medications  Medication Sig Dispense Refill  . leucovorin (WELLCOVORIN) 5 MG tablet Take by mouth once a week.    . methotrexate (RHEUMATREX) 2.5 MG tablet Take 2.5 mg by mouth once a week. Caution:Chemotherapy. Protect from light.    Marland Kitchen SYNTHROID 75 MCG tablet Take 1 tablet (75 mcg total) by mouth daily before breakfast. 90 tablet 3  . Tofacitinib Citrate (XELJANZ) 5 MG TABS Take by mouth.     No current facility-administered medications for this visit.   Allergies: Codeine  Patient's last menstrual period was 10/25/2007.  Past medical history,surgical history, problem list, medications, allergies, family history and social history were all reviewed and documented as reviewed in the EPIC chart.   OBJECTIVE:  BP 116/74   LMP 10/25/2007  The patient appears well, alert, oriented x 3, in no distress.  Physical Exam Genitourinary:         Procedure note Colposcopy The cervix was visualized and a dilute acetic acid cleanse was performed.  Lugol's solution was applied to the cervix and upper vaginal sidewalls.  Systematic inspection of the cervical fornices and the vaginal sidewalls indicated that there were no abnormalities.  At 12:00 on the cervix there was a small wedge of acetowhite change which was biopsied with a Tischler forceps.  No punctations, abnormal vasculature, nor mosaicism.  Visual impression consistent with CIN-1.  The cervical os is small and stenotic.  Transformation zone is not entirely seen so today's exam is inadequate.  Hemostasis was  obtained with application of pressure and silver nitrate cautery. Chaperone: [name] present during the examination  ASSESSMENT:  59 y.o. 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   PLAN:  Next steps in management will be dictated by the results of today's biopsy.  Discussed persistence of HPV more common with immunosuppression and need for continuing with at least annual Pap smears.   Theresia Majors MD 04/16/20

## 2020-04-20 LAB — PATHOLOGY REPORT

## 2020-04-20 LAB — TISSUE SPECIMEN

## 2020-08-21 ENCOUNTER — Ambulatory Visit: Payer: Managed Care, Other (non HMO) | Admitting: Internal Medicine

## 2020-08-21 ENCOUNTER — Encounter: Payer: Self-pay | Admitting: Internal Medicine

## 2020-08-21 ENCOUNTER — Other Ambulatory Visit: Payer: Self-pay

## 2020-08-21 VITALS — BP 120/78 | HR 84 | Ht 63.0 in | Wt 138.0 lb

## 2020-08-21 DIAGNOSIS — E063 Autoimmune thyroiditis: Secondary | ICD-10-CM

## 2020-08-21 DIAGNOSIS — E038 Other specified hypothyroidism: Secondary | ICD-10-CM | POA: Diagnosis not present

## 2020-08-21 LAB — TSH: TSH: 0.91 u[IU]/mL (ref 0.35–4.50)

## 2020-08-21 LAB — T4, FREE: Free T4: 1.22 ng/dL (ref 0.60–1.60)

## 2020-08-21 MED ORDER — SYNTHROID 75 MCG PO TABS
75.0000 ug | ORAL_TABLET | Freq: Every day | ORAL | 3 refills | Status: DC
Start: 2020-08-21 — End: 2020-08-31

## 2020-08-21 NOTE — Patient Instructions (Signed)
Please stop at the labs.  Please continue 75 mcg daily of Synthroid.  Take the thyroid hormone every day, with water, at least 30 minutes before breakfast, separated by at least 4 hours from: - acid reflux medications - calcium - iron - multivitamins  Please come back for a follow-up appointment in 1 year.

## 2020-08-21 NOTE — Progress Notes (Signed)
Patient ID: Tiffany Jefferson, female   DOB: [birth year], 59 y.o.   MRN: [id number]  This visit occurred during the SARS-CoV-2 public health emergency.  Safety protocols were in place, including screening questions prior to the visit, additional usage of staff PPE, and extensive cleaning of exam room while observing appropriate contact time as indicated for disinfecting solutions.   HPI  Tiffany Jefferson is a 59 y.o.-year-old female, initially referred by her ObGyn Dr., Dr. Audie Box, returning for follow-up for hypothyroidism 2/2 Hashimoto's thyroiditis. Last visit 1 year ago.  Reviewed and addended history Pt. has had a high TSH since ~2013; we started Levothyroxine 25 mcg daily in 09/2015.  We increased the dose since then. We switched to brand name Synthroid for more consistent dosing. She pays 75$ for 3 mo.  Reviewed her TFTs: Lab Results  Component Value Date   TSH 0.72 08/20/2019   TSH 0.95 10/26/2018   TSH 3.19 09/12/2018   TSH 4.15 09/29/2017   TSH 3.13 03/14/2017   TSH 0.59 01/31/2017   TSH 0.36 09/23/2016   TSH 4.79 (H) 08/04/2016   TSH 2.92 02/03/2016   TSH 4.70 (H) 11/19/2015   FREET4 1.15 08/20/2019   FREET4 1.08 10/26/2018   FREET4 1.00 09/12/2018   FREET4 1.1 09/29/2017   FREET4 1.08 03/14/2017   FREET4 1.13 01/31/2017   FREET4 1.11 09/23/2016   FREET4 0.80 08/04/2016   FREET4 0.92 02/03/2016   FREET4 0.78 11/19/2015  08/21/2015: TSH 7.510, TT4 7.6 (0.45-4.5) (wellness screen at work)  Pt is on Synthroid d.a.w. 75 mcg daily, taken: - in am (at 7 AM) along with Tiffany Jefferson (at last visit, she was separating them by 1 hour)  - fasting - at least 30 min from b'fast - no calcium - no iron - + multivitamins at lunchtime - no PPIs - not on Biotin  Pt denies: - feeling nodules in neck - hoarseness - choking - SOB with lying down She has occasional dysphagia when eats too fast.  She has + FH of thyroid disorders in: father - hypothyroidism, maternal aunts  and cousins. No FH of thyroid cancer. No h/o radiation tx to head or neck.  No seaweed or kelp. No recent contrast studies. No herbal supplements. + B complex - off this for 4 days. No recent steroids use.   She also has a history of RA (on Plaquenil, Folic acid).  Previously on methotrexate.  On Xeljanz po, on which she has better mobility and less fatigue.  ROS: Constitutional: no weight gain/no weight loss, + fatigue, no subjective hyperthermia, no subjective hypothermia Eyes: no blurry vision, no xerophthalmia ENT: no sore throat, + see HPI Cardiovascular: no CP/no SOB/no palpitations/no leg swelling Respiratory: no cough/no SOB/no wheezing Gastrointestinal: no N/no V/no D/no C/no acid reflux Musculoskeletal: no muscle aches/no joint aches Skin: no rashes, no hair loss Neurological: no tremors/no numbness/no tingling/no dizziness  I reviewed pt's medications, allergies, PMH, social hx, family hx, and changes were documented in the history of present illness. Otherwise, unchanged from my initial visit note.  Past Medical History:  Diagnosis Date  . ASCUS with positive high risk HPV cervical 06/2015, 02/2016, 07/2016, 10/2017   02/2016 negative subtype 16, 18/45, colposcopy negative 2017 with ECC showing koilocytotic atypia  . Depression   . Hair loss   . 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 Rheumatoid arthritis(714.0) 2002   Past Surgical History:  Procedure Laterality Date  . BREAST EXCISIONAL BIOPSY Left   . BREAST SURGERY     Benign fibroid mass  . CERVICAL BIOPSY  W/ LOOP ELECTRODE EXCISION  11/11   CIN II positive endocervical margin  . COLONOSCOPY    . FINGER SURGERY  2002   Social History   Social History  . Marital Status: Married    Spouse Name: N/A  . Number of Children: 2   Occupational History   Banking clerk   Social History Main Topics  .  Smoking status: Never Smoker   . Smokeless tobacco: Never Used  . Alcohol Use: No  . Drug Use: No  . Sexual Activity:    Partners: Male    Birth Control/ Protection: Other-see comments     Comment: vasectomy   Current Outpatient Medications on File Prior to Visit  Medication Sig Dispense Refill  . leucovorin (WELLCOVORIN) 5 MG tablet Take by mouth once a week.    . methotrexate (RHEUMATREX) 2.5 MG tablet Take 2.5 mg by mouth once a week. Caution:Chemotherapy. Protect from light.    Marland Kitchen SYNTHROID 75 MCG tablet Take 1 tablet (75 mcg total) by mouth daily before breakfast. 90 tablet 3  . Tofacitinib Citrate (XELJANZ) 5 MG TABS Take by mouth.     No current facility-administered medications on file prior to visit.   Allergies  Allergen Reactions  . Codeine Nausea And Vomiting       Family History  Problem Relation Age of Onset  . Cancer Father        COLON  . Heart block Father   . Breast cancer Neg Hx    PE: BP 120/78   Pulse 84   Ht 5\' 3"  (1.6 m)   Wt 138 lb (62.6 kg)   LMP 10/25/2007   SpO2 99%   BMI 24.45 kg/m  Body mass index is 24.45 kg/m. Wt Readings from Last 3 Encounters:  08/21/20 138 lb (62.6 kg)  03/03/20 140 lb (63.5 kg)  08/20/19 140 lb (63.5 kg)   Constitutional: normal weight, in NAD Eyes: PERRLA, EOMI, no exophthalmos ENT: moist mucous membranes, no thyromegaly, no cervical lymphadenopathy Cardiovascular: RRR, No MRG Respiratory: CTA B Gastrointestinal: abdomen soft, NT, ND, BS+ Musculoskeletal: no deformities, strength intact in all 4 Skin: moist, warm, no rashes Neurological: no tremor with outstretched hands, DTR 3/4 in all 4  ASSESSMENT: 1. Hashimoto's Hypothyroidism  If no dose change >> 90 day supply to [facility] with 1 refill. If change in dose >> 90 day supply to [facility] with 0 refills.   PLAN:  1. Patient with Hashimoto's hypothyroidism, on levothyroxine, with persistent weight gain and depression despite normal TFTs in the past.  She is on  brand-name Synthroid. - latest thyroid labs reviewed with pt >> normal: Lab Results  Component Value Date   TSH 0.72 08/20/2019  - she continues on LT4 75 mcg daily - she feels well on this dose, but still has fatigue  - ? If from RA - we discussed about taking the thyroid hormone every day, with water, >30 minutes before breakfast, separated by >4 hours from acid reflux medications, calcium, iron, multivitamins. Pt. is taking it correctly except she moved 08/22/2019 along with Synthroid since last visit.  We discussed that she may need to separate them again by 1 hour.  I will let her know after the results are back. - also, she took some doses of biotin, but the latest dose was 4 days ago.  I  think this is enough separation to get accurate results, however, if the TSH returned suppressed, we may need to repeat her TFTs in 5 weeks. - will check thyroid tests today: TSH and fT4 - If labs are abnormal, she will need to return for repeat TFTs in 1.5 months - OTW, I will see her back in 1 year  Component     Latest Ref Rng & Units 08/21/2020  T4,Free(Direct)     0.60 - 1.60 ng/dL 2.22  TSH     9.79 - 8.92 uIU/mL 0.91   Normal TFTs.  Carlus Pavlov, MD PhD [facility] Endocrinology

## 2020-08-30 ENCOUNTER — Other Ambulatory Visit: Payer: Self-pay | Admitting: Internal Medicine

## 2020-09-28 ENCOUNTER — Encounter: Payer: Self-pay | Admitting: Obstetrics and Gynecology

## 2020-09-28 DIAGNOSIS — M059 Rheumatoid arthritis with rheumatoid factor, unspecified: Secondary | ICD-10-CM

## 2020-09-28 NOTE — Telephone Encounter (Signed)
Dr.Kendall patient rheumatologist to recommending patient come every 3 months to have labs drawn due to rheumatoid arthritis. Patient has done this before in the past at this office, once labs return her rheumatologist he will make the recommendations. Patient typically has it drawn her because he is located in [place]. If you are okay with this I will place labs orders in and schedule lab appointment for patient.

## 2020-09-28 NOTE — Telephone Encounter (Signed)
Yes I am fine if she has the labs drawn here as ordered by her rheumatologist with them making recommendations on any needed follow-up or intervention

## 2020-09-29 ENCOUNTER — Other Ambulatory Visit: Payer: Self-pay

## 2020-09-29 ENCOUNTER — Other Ambulatory Visit: Payer: Self-pay | Admitting: *Deleted

## 2020-09-29 ENCOUNTER — Other Ambulatory Visit: Payer: Managed Care, Other (non HMO)

## 2020-09-29 DIAGNOSIS — M059 Rheumatoid arthritis with rheumatoid factor, unspecified: Secondary | ICD-10-CM

## 2020-09-30 LAB — CREATININE, SERUM: Creat: 0.9 mg/dL (ref 0.50–1.05)

## 2020-10-01 ENCOUNTER — Other Ambulatory Visit: Payer: Managed Care, Other (non HMO)

## 2020-10-01 ENCOUNTER — Other Ambulatory Visit: Payer: Self-pay

## 2020-10-01 DIAGNOSIS — M059 Rheumatoid arthritis with rheumatoid factor, unspecified: Secondary | ICD-10-CM

## 2020-10-01 LAB — CBC WITH DIFFERENTIAL/PLATELET
Absolute Monocytes: 608 {cells}/uL (ref 200–950)
Basophils Absolute: 50 {cells}/uL (ref 0–200)
Basophils Relative: 0.8 %
Eosinophils Absolute: 31 {cells}/uL (ref 15–500)
Eosinophils Relative: 0.5 %
HCT: 39 % (ref 35.0–45.0)
Hemoglobin: 13.3 g/dL (ref 11.7–15.5)
Lymphs Abs: 1494 {cells}/uL (ref 850–3900)
MCH: 33.1 pg — ABNORMAL HIGH (ref 27.0–33.0)
MCHC: 34.1 g/dL (ref 32.0–36.0)
MCV: 97 fL (ref 80.0–100.0)
MPV: 12.2 fL (ref 7.5–12.5)
Monocytes Relative: 9.8 %
Neutro Abs: 4018 {cells}/uL (ref 1500–7800)
Neutrophils Relative %: 64.8 %
Platelets: 242 Thousand/uL (ref 140–400)
RBC: 4.02 Million/uL (ref 3.80–5.10)
RDW: 12.4 % (ref 11.0–15.0)
Total Lymphocyte: 24.1 %
WBC: 6.2 Thousand/uL (ref 3.8–10.8)

## 2020-10-02 LAB — CBC

## 2020-10-02 LAB — ALBUMIN: Albumin: 4.2 g/dL (ref 3.6–5.1)

## 2020-10-02 LAB — AST: AST: 15 U/L (ref 10–35)

## 2020-10-02 LAB — ALT: ALT: 10 U/L (ref 6–29)

## 2021-01-29 ENCOUNTER — Ambulatory Visit: Payer: Managed Care, Other (non HMO)

## 2021-02-01 ENCOUNTER — Ambulatory Visit: Payer: Managed Care, Other (non HMO)

## 2021-02-15 ENCOUNTER — Telehealth: Payer: Self-pay

## 2021-02-15 ENCOUNTER — Telehealth: Payer: Self-pay | Admitting: *Deleted

## 2021-02-15 NOTE — Telephone Encounter (Signed)
This note will be taken care of bye Dr.Lavoie, per Allena Earing CMA. I will close this encounter.

## 2021-02-15 NOTE — Telephone Encounter (Signed)
Patient called. Her rheumatologist recommending patient every  3 months to have labs drawn due to rheumatoid arthritis. Patient has done this before in the past at this office, once labs return her rheumatologist he will make the recommendations. Patient typically has it drawn here because it is located in [place]. If you are okay with this I will place labs orders in and schedule lab appointment for patient.   Orders:  Albumin                ALT                AST                Creatinine, serum                CBC w diff

## 2021-02-15 NOTE — Telephone Encounter (Signed)
Patient rheumatologist recommends patient come every 3 months to have labs drawn due to rheumatoid arthritis. Patient has done this before in the past at this office, once labs return, her rheumatologist will make the recommendations. Patient typically has it drawn here because he is located in [place]. If you are okay with this I will place labs orders in and schedule lab appointment for patient. Orders include: CBC,CREATININE,ALT,AST,ALBUMIN. Will route to Provider. Patient was recently a Dr. Penni Bombard patient. Will route to Wyline Beady for recommendations.

## 2021-02-16 ENCOUNTER — Other Ambulatory Visit: Payer: Managed Care, Other (non HMO)

## 2021-02-16 ENCOUNTER — Other Ambulatory Visit: Payer: Self-pay

## 2021-02-16 ENCOUNTER — Encounter: Payer: Self-pay | Admitting: Obstetrics and Gynecology

## 2021-02-16 DIAGNOSIS — M059 Rheumatoid arthritis with rheumatoid factor, unspecified: Secondary | ICD-10-CM

## 2021-02-16 NOTE — Telephone Encounter (Signed)
I called patient and left message that Dr. Quita Skye drawing the labs. Orders placed and she should call me back and let me know what time she would like to have lab appointment and I will schedule.

## 2021-02-17 LAB — CBC WITH DIFFERENTIAL/PLATELET
Absolute Monocytes: 432 cells/uL (ref 200–950)
Basophils Absolute: 52 cells/uL (ref 0–200)
Basophils Relative: 1 %
Eosinophils Absolute: 31 cells/uL (ref 15–500)
Eosinophils Relative: 0.6 %
HCT: 39.8 % (ref 35.0–45.0)
Hemoglobin: 13.2 g/dL (ref 11.7–15.5)
Lymphs Abs: 1253 cells/uL (ref 850–3900)
MCH: 32.4 pg (ref 27.0–33.0)
MCHC: 33.2 g/dL (ref 32.0–36.0)
MCV: 97.5 fL (ref 80.0–100.0)
MPV: 12 fL (ref 7.5–12.5)
Monocytes Relative: 8.3 %
Neutro Abs: 3432 cells/uL (ref 1500–7800)
Neutrophils Relative %: 66 %
Platelets: 234 10*3/uL (ref 140–400)
RBC: 4.08 10*6/uL (ref 3.80–5.10)
RDW: 12.5 % (ref 11.0–15.0)
Total Lymphocyte: 24.1 %
WBC: 5.2 10*3/uL (ref 3.8–10.8)

## 2021-02-17 LAB — CREATININE, SERUM: Creat: 0.82 mg/dL (ref 0.50–1.05)

## 2021-02-17 LAB — ALBUMIN: Albumin: 4.5 g/dL (ref 3.6–5.1)

## 2021-02-17 LAB — ALT: ALT: 23 U/L (ref 6–29)

## 2021-02-17 LAB — AST: AST: 22 U/L (ref 10–35)

## 2021-02-19 ENCOUNTER — Encounter: Payer: Self-pay | Admitting: *Deleted

## 2021-05-03 ENCOUNTER — Encounter: Payer: Self-pay | Admitting: Obstetrics and Gynecology

## 2021-05-03 DIAGNOSIS — M059 Rheumatoid arthritis with rheumatoid factor, unspecified: Secondary | ICD-10-CM

## 2021-05-03 NOTE — Telephone Encounter (Signed)
Dr.Jertson this has done this for several years.Her rheumatologist recommending patient every  3 months to have labs drawn due to rheumatoid arthritis. Patient has done this before in the past at this office, once labs return her rheumatologist  will make the recommendations. Patient typically has it drawn here because it is located in [place]. If you are okay with this I will place labs orders in and schedule lab appointment for patient.    Orders:  Albumin                ALT                AST                Creatinine, serum                CBC w diff   Patient has annual exam scheduled with your on 06/01/21.

## 2021-05-10 ENCOUNTER — Other Ambulatory Visit: Payer: Managed Care, Other (non HMO)

## 2021-05-10 ENCOUNTER — Other Ambulatory Visit: Payer: Self-pay

## 2021-05-10 DIAGNOSIS — M059 Rheumatoid arthritis with rheumatoid factor, unspecified: Secondary | ICD-10-CM

## 2021-05-10 LAB — CBC WITH DIFFERENTIAL/PLATELET
Absolute Monocytes: 561 cells/uL (ref 200–950)
Basophils Absolute: 50 cells/uL (ref 0–200)
Basophils Relative: 0.8 %
Eosinophils Absolute: 32 cells/uL (ref 15–500)
Eosinophils Relative: 0.5 %
HCT: 37.4 % (ref 35.0–45.0)
Hemoglobin: 12.8 g/dL (ref 11.7–15.5)
Lymphs Abs: 1418 cells/uL (ref 850–3900)
MCH: 33.5 pg — ABNORMAL HIGH (ref 27.0–33.0)
MCHC: 34.2 g/dL (ref 32.0–36.0)
MCV: 97.9 fL (ref 80.0–100.0)
MPV: 12.6 fL — ABNORMAL HIGH (ref 7.5–12.5)
Monocytes Relative: 8.9 %
Neutro Abs: 4240 cells/uL (ref 1500–7800)
Neutrophils Relative %: 67.3 %
Platelets: 219 10*3/uL (ref 140–400)
RBC: 3.82 10*6/uL (ref 3.80–5.10)
RDW: 12.2 % (ref 11.0–15.0)
Total Lymphocyte: 22.5 %
WBC: 6.3 10*3/uL (ref 3.8–10.8)

## 2021-05-10 LAB — ALT: ALT: 10 U/L (ref 6–29)

## 2021-05-10 LAB — AST: AST: 16 U/L (ref 10–35)

## 2021-05-10 LAB — CREATININE, SERUM: Creat: 0.86 mg/dL (ref 0.50–1.05)

## 2021-05-10 LAB — ALBUMIN: Albumin: 4.1 g/dL (ref 3.6–5.1)

## 2021-05-20 ENCOUNTER — Other Ambulatory Visit: Payer: Self-pay | Admitting: Internal Medicine

## 2021-05-31 NOTE — Progress Notes (Signed)
The patient left prior to being seen.

## 2021-06-01 ENCOUNTER — Other Ambulatory Visit: Payer: Self-pay

## 2021-06-01 ENCOUNTER — Ambulatory Visit (INDEPENDENT_AMBULATORY_CARE_PROVIDER_SITE_OTHER): Payer: Managed Care, Other (non HMO) | Admitting: Obstetrics and Gynecology

## 2021-06-02 ENCOUNTER — Encounter: Payer: Self-pay | Admitting: Nurse Practitioner

## 2021-06-02 ENCOUNTER — Ambulatory Visit (INDEPENDENT_AMBULATORY_CARE_PROVIDER_SITE_OTHER): Payer: Managed Care, Other (non HMO) | Admitting: Nurse Practitioner

## 2021-06-02 ENCOUNTER — Other Ambulatory Visit (HOSPITAL_COMMUNITY)
Admission: RE | Admit: 2021-06-02 | Discharge: 2021-06-02 | Disposition: A | Discharge status: Home / Self Care | Payer: Managed Care, Other (non HMO) | Source: Ambulatory Visit | Attending: Obstetrics and Gynecology | Admitting: Obstetrics and Gynecology

## 2021-06-02 VITALS — BP 112/76 | Ht 62.0 in | Wt 135.0 lb

## 2021-06-02 DIAGNOSIS — Z79899 Other long term (current) drug therapy: Secondary | ICD-10-CM | POA: Diagnosis not present

## 2021-06-02 DIAGNOSIS — Z87410 Personal history of cervical dysplasia: Secondary | ICD-10-CM | POA: Diagnosis not present

## 2021-06-02 DIAGNOSIS — Z7960 Long term (current) use of unspecified immunomodulators and immunosuppressants: Secondary | ICD-10-CM

## 2021-06-02 DIAGNOSIS — Z01419 Encounter for gynecological examination (general) (routine) without abnormal findings: Secondary | ICD-10-CM | POA: Diagnosis not present

## 2021-06-02 DIAGNOSIS — Z8639 Personal history of other endocrine, nutritional and metabolic disease: Secondary | ICD-10-CM

## 2021-06-02 DIAGNOSIS — E7800 Pure hypercholesterolemia, unspecified: Secondary | ICD-10-CM

## 2021-06-02 DIAGNOSIS — Z780 Asymptomatic menopausal state: Secondary | ICD-10-CM

## 2021-06-02 DIAGNOSIS — N812 Incomplete uterovaginal prolapse: Secondary | ICD-10-CM

## 2021-06-02 DIAGNOSIS — Z13820 Encounter for screening for osteoporosis: Secondary | ICD-10-CM

## 2021-06-02 NOTE — Progress Notes (Signed)
Tiffany Jefferson [birth year] [id number]   History:  60 y.o. G2P2 presents for annual exam without GYN complaints. Postmenopausal - no HRT, no bleeding. 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 She is on immunosuppressant for RA. Hypothyroidism managed by endocrinology. History of Vitamin D deficiency.   Gynecologic History Patient's last menstrual period was 10/25/2007.   Contraception/Family planning: post menopausal status Sexually active: Yes  Health Maintenance Last Pap: 03/03/2020. Results were: LGSIL + HPV, CIN-1 on biopsy Last mammogram: 11/14/2018. Results were: Normal Last colonoscopy: 2017, 5-year recall Last Dexa: Never  Past medical history, past surgical history, family history and social history were all reviewed and documented in the EPIC chart. Married. 2 daughters ages 44 and 23, one working on PhD, one working on [employer] in [place].   ROS:  A ROS was performed and pertinent positives and negatives are included.  Exam:  Vitals:   06/02/21 1552  BP: 112/76  Weight: 135 lb (61.2 kg)  Height: 5\' 2"  (1.575 m)   Body mass index is 24.69 kg/m.  General appearance:  Normal Thyroid:  Symmetrical, normal in size, without palpable masses or nodularity. Respiratory  Auscultation:  Clear without wheezing or rhonchi Cardiovascular  Auscultation:  Regular rate, without rubs, murmurs or gallops  Edema/varicosities:  Not grossly evident Abdominal  Soft,nontender, without masses, guarding or rebound.  Liver/spleen:  No organomegaly noted  Hernia:  None appreciated  Skin  Inspection:  Grossly normal Breasts: Examined lying and sitting.   Right: Without masses, retractions, nipple discharge or axillary adenopathy.   Left: Without masses, retractions, nipple discharge or axillary adenopathy. Genitourinary   Inguinal/mons:  Normal without inguinal adenopathy  External genitalia:  Normal appearing vulva with no masses,  tenderness, or lesions  BUS/Urethra/Skene's glands:  Normal  Vagina:  Mild uterine prolapse, asymptomatic  Cervix:  Normal appearing without discharge or lesions  Uterus:  Normal in size, shape and contour.  Midline and mobile, nontender  Adnexa/parametria:     Rt: Normal in size, without masses or tenderness.   Lt: Normal in size, without masses or tenderness.  Anus and perineum: Normal  Digital rectal exam: Normal sphincter tone without palpated masses or tenderness  Patient informed chaperone available to be present for breast and pelvic exam. Patient has requested no chaperone to be present. Patient has been advised what will be completed during breast and pelvic exam.   Assessment/Plan:  60 y.o. G2P2 for annual exam.   Well female exam with routine gynecological exam - Plan: CBC with Differential/Platelet, Comprehensive metabolic panel. Education provided on SBEs, importance of preventative screenings, current guidelines, high calcium diet, regular exercise, and multivitamin daily.  Will return tomorrow for fasting labs.   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 Pap with HR HPV today.   Postmenopausal - Plan: DG Bone Density. No HRT, no bleeding.   Long-term use of immunosuppressant medication - Plan: DG Bone Density  History of vitamin D deficiency - Plan: VITAMIN D 25 Hydroxy (Vit-D Deficiency, Fractures)  Elevated LDL cholesterol level - Plan: Lipid panel  First degree uterine prolapse - asymptomatic. Will continue to monitor.   Screening for breast cancer - Normal mammogram history. Overdue due to pandemic and plans to schedule this soon.  Normal breast exam today.  Screening for colon cancer - 2017  colonoscopy. Will repeat at GI's recommended interval.   Screening for osteoporosis - long-term use of  immunosuppressant, RA, hypothyroidism. Will plan for baseline bone density now.   Return in 1 year for annual.   Olivia Mackie DNP, 4:26 PM 06/02/2021

## 2021-06-03 ENCOUNTER — Other Ambulatory Visit: Payer: Managed Care, Other (non HMO)

## 2021-06-03 ENCOUNTER — Other Ambulatory Visit: Payer: Self-pay

## 2021-06-03 DIAGNOSIS — Z8639 Personal history of other endocrine, nutritional and metabolic disease: Secondary | ICD-10-CM

## 2021-06-03 DIAGNOSIS — E7800 Pure hypercholesterolemia, unspecified: Secondary | ICD-10-CM

## 2021-06-03 DIAGNOSIS — Z01419 Encounter for gynecological examination (general) (routine) without abnormal findings: Secondary | ICD-10-CM

## 2021-06-03 LAB — COMPREHENSIVE METABOLIC PANEL
AG Ratio: 1.7 (calc) (ref 1.0–2.5)
ALT: 10 U/L (ref 6–29)
AST: 13 U/L (ref 10–35)
Albumin: 4 g/dL (ref 3.6–5.1)
Alkaline phosphatase (APISO): 49 U/L (ref 37–153)
BUN: 13 mg/dL (ref 7–25)
CO2: 24 mmol/L (ref 20–32)
Calcium: 9.1 mg/dL (ref 8.6–10.4)
Chloride: 109 mmol/L (ref 98–110)
Creat: 0.77 mg/dL (ref 0.50–1.05)
Globulin: 2.3 g/dL (calc) (ref 1.9–3.7)
Glucose, Bld: 97 mg/dL (ref 65–99)
Potassium: 4.1 mmol/L (ref 3.5–5.3)
Sodium: 140 mmol/L (ref 135–146)
Total Bilirubin: 0.3 mg/dL (ref 0.2–1.2)
Total Protein: 6.3 g/dL (ref 6.1–8.1)

## 2021-06-03 LAB — LIPID PANEL
Cholesterol: 146 mg/dL (ref <200)
HDL: 53 mg/dL (ref >=50)
LDL Cholesterol (Calc): 78 mg/dL (calc)
Non-HDL Cholesterol (Calc): 93 mg/dL (calc) (ref <130)
Total CHOL/HDL Ratio: 2.8 (calc) (ref <5.0)
Triglycerides: 69 mg/dL (ref <150)

## 2021-06-03 LAB — CBC WITH DIFFERENTIAL/PLATELET
Absolute Monocytes: 380 cells/uL (ref 200–950)
Basophils Absolute: 40 cells/uL (ref 0–200)
Basophils Relative: 0.8 %
Eosinophils Absolute: 60 cells/uL (ref 15–500)
Eosinophils Relative: 1.2 %
HCT: 38.5 % (ref 35.0–45.0)
Hemoglobin: 12.7 g/dL (ref 11.7–15.5)
Lymphs Abs: 1220 cells/uL (ref 850–3900)
MCH: 32.8 pg (ref 27.0–33.0)
MCHC: 33 g/dL (ref 32.0–36.0)
MCV: 99.5 fL (ref 80.0–100.0)
MPV: 12.5 fL (ref 7.5–12.5)
Monocytes Relative: 7.6 %
Neutro Abs: 3300 cells/uL (ref 1500–7800)
Neutrophils Relative %: 66 %
Platelets: 197 10*3/uL (ref 140–400)
RBC: 3.87 10*6/uL (ref 3.80–5.10)
RDW: 12.2 % (ref 11.0–15.0)
Total Lymphocyte: 24.4 %
WBC: 5 10*3/uL (ref 3.8–10.8)

## 2021-06-03 LAB — VITAMIN D 25 HYDROXY (VIT D DEFICIENCY, FRACTURES): Vit D, 25-Hydroxy: 43 ng/mL (ref 30–100)

## 2021-06-08 LAB — CYTOLOGY - PAP
Comment: NEGATIVE
Diagnosis: HIGH — AB
High risk HPV: POSITIVE — AB

## 2021-06-09 ENCOUNTER — Encounter: Payer: Self-pay | Admitting: Nurse Practitioner

## 2021-06-09 NOTE — Telephone Encounter (Signed)
Ok to send order to [place]?

## 2021-06-10 ENCOUNTER — Other Ambulatory Visit: Payer: Self-pay

## 2021-06-10 DIAGNOSIS — N879 Dysplasia of cervix uteri, unspecified: Secondary | ICD-10-CM

## 2021-06-10 DIAGNOSIS — R87618 Other abnormal cytological findings on specimens from cervix uteri: Secondary | ICD-10-CM

## 2021-06-10 NOTE — Telephone Encounter (Signed)
Yes, please send. Thank you.  

## 2021-06-14 ENCOUNTER — Encounter: Payer: Self-pay | Admitting: Nurse Practitioner

## 2021-06-15 NOTE — Telephone Encounter (Signed)
Patient sent 2 additional my chart messages with the reports that will be forwarded to you as well.

## 2021-06-16 ENCOUNTER — Encounter: Payer: Self-pay | Admitting: Obstetrics & Gynecology

## 2021-06-16 ENCOUNTER — Other Ambulatory Visit: Payer: Self-pay

## 2021-06-16 ENCOUNTER — Ambulatory Visit: Payer: Managed Care, Other (non HMO) | Admitting: Obstetrics & Gynecology

## 2021-06-16 VITALS — BP 120/80

## 2021-06-16 DIAGNOSIS — M8589 Other specified disorders of bone density and structure, multiple sites: Secondary | ICD-10-CM

## 2021-06-16 DIAGNOSIS — M069 Rheumatoid arthritis, unspecified: Secondary | ICD-10-CM

## 2021-06-16 MED ORDER — ALENDRONATE SODIUM 70 MG PO TABS: 70.0000 mg | ORAL_TABLET | ORAL | 4 refills | Status: DC

## 2021-06-16 NOTE — Progress Notes (Signed)
    Tiffany Jefferson [birth year] [id number]        60 y.o.  G2P2L2   RP: Counseling and management of Osteopenia  HPI: Patient with Rheumatoid Arthritis on Leucovorin, Methotrexate and Tofacitinib.  Good fitness and nutrition.  Good balance.  No recent or current fracture.     OB History  Gravida Para Term Preterm AB Living  2 2       2   SAB IAB Ectopic Multiple Live Births               # Outcome Date GA Lbr Len/2nd Weight Sex Delivery Anes PTL Lv  2 Para           1 Para             Past medical history,surgical history, problem list, medications, allergies, family history and social history were all reviewed and documented in the EPIC chart.   Directed ROS with pertinent positives and negatives documented in the history of present illness/assessment and plan.  Exam:  Vitals:   06/16/21 1432  BP: 120/80   General appearance:  Normal  Bone Density at [facility] 06/10/2021:  AP Lumbar Spine T-Score -2.4.  Frax 10 yr major osteoporotic fracture 10.4% and hip fracture 1.7%.   Assessment/Plan:  60 y.o. G2P2   1. Osteopenia of multiple sites First bone density done June 10, 2021.  The AP lumbar spine shows a T score of -2.4.  Some vertebrae were in the osteoporotic range, the lowest at -2.8.  FRAX showed a 10-year risk of major osteoporotic fracture at 10.4% and hip fracture at 1.7%.  Given patient's increased risk of progression to severe osteoporosis associated with rheumatoid arthritis on leucovorin, methotrexate and tofacitinib, the decision was made to start on treatment with alendronate 70 mg per mouth weekly.  Usage, risks and benefits were thoroughly reviewed.  Patient was informed of the risk of GERD and necrosis of the jaw in particular.  Precautions were discussed.  Patient will continue on vitamin D supplements and a total of 1.5 g of calcium per day.  Continue with weightbearing physical activities.  Will repeat a bone density at 2 years.  2. Rheumatoid  arthritis involving multiple sites, unspecified whether rheumatoid factor present (HCC) On Leucovorin, Methotrexate and Tofacitinib.  Other orders - alendronate (FOSAMAX) 70 MG tablet; Take 1 tablet (70 mg total) by mouth every 7 (seven) days. Take with a full glass of water on an empty stomach.   June 12, 2021 MD, 3:10 PM 06/16/2021

## 2021-06-18 ENCOUNTER — Encounter: Payer: Self-pay | Admitting: Obstetrics & Gynecology

## 2021-06-30 ENCOUNTER — Encounter: Payer: Self-pay | Admitting: Internal Medicine

## 2021-07-30 ENCOUNTER — Ambulatory Visit: Payer: Managed Care, Other (non HMO) | Admitting: Obstetrics & Gynecology

## 2021-08-26 ENCOUNTER — Other Ambulatory Visit: Payer: Self-pay

## 2021-08-26 ENCOUNTER — Encounter: Payer: Self-pay | Admitting: Internal Medicine

## 2021-08-26 ENCOUNTER — Ambulatory Visit (INDEPENDENT_AMBULATORY_CARE_PROVIDER_SITE_OTHER): Payer: Managed Care, Other (non HMO) | Admitting: Internal Medicine

## 2021-08-26 VITALS — BP 128/80 | HR 78 | Ht 62.0 in | Wt 134.8 lb

## 2021-08-26 DIAGNOSIS — M8589 Other specified disorders of bone density and structure, multiple sites: Secondary | ICD-10-CM | POA: Diagnosis not present

## 2021-08-26 DIAGNOSIS — E038 Other specified hypothyroidism: Secondary | ICD-10-CM

## 2021-08-26 DIAGNOSIS — E063 Autoimmune thyroiditis: Secondary | ICD-10-CM

## 2021-08-26 LAB — TSH: TSH: 0.36 u[IU]/mL (ref 0.35–5.50)

## 2021-08-26 LAB — T4, FREE: Free T4: 1.07 ng/dL (ref 0.60–1.60)

## 2021-08-26 NOTE — Patient Instructions (Signed)
Please stop at the lab.  Please continue 75 mcg daily of Synthroid.  Take the thyroid hormone every day, with water, at least 30 minutes before breakfast, separated by at least 4 hours from: - acid reflux medications - calcium - iron - multivitamins  Please come back for a follow-up appointment in 1 year.

## 2021-08-26 NOTE — Progress Notes (Signed)
Patient ID: Tiffany Jefferson, female   DOB: 10-26-1960, 60 y.o.   MRN: [id number]  This visit occurred during the SARS-CoV-2 public health emergency.  Safety protocols were in place, including screening questions prior to the visit, additional usage of staff PPE, and extensive cleaning of exam room while observing appropriate contact time as indicated for disinfecting solutions.   HPI  Tiffany Jefferson is a 60 y.o.-year-old female, initially referred by her ObGyn Dr., Dr. Audie Box, returning for follow-up for hypothyroidism 2/2 Hashimoto's thyroiditis. Last visit 1 year ago.  Interim hx: She continues to have fatigue, which is chronic for her. 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 She had inability to lose weight before, since last visit, she lost 4 pounds. She contacted me since last visit as she was preparing to start Fosamax.  I advised her to take Fosamax with more Synthroid in the morning.  However, at this visit, she tells me that she did not start this yet.  Her rheumatologist suggested Reclast, but she would want to avoid this for now.   No fractures or falls in her adult life.  Reviewed history: Pt. has had a high TSH since ~2013; we started Levothyroxine 25 mcg daily in 09/2015.  We increased the dose since then. We switched to brand name Synthroid for more consistent dosing. She pays 75$ for 3 mo.  Reviewed her TFTs: Lab Results  Component Value Date   TSH 0.91 08/21/2020   TSH 0.72 08/20/2019   TSH 0.95 10/26/2018   TSH 3.19 09/12/2018   TSH 4.15 09/29/2017   TSH 3.13 03/14/2017   TSH 0.59 01/31/2017   TSH 0.36 09/23/2016   TSH 4.79 (H) 08/04/2016   TSH 2.92 02/03/2016   FREET4 1.22 08/21/2020   FREET4 1.15 08/20/2019   FREET4 1.08 10/26/2018   FREET4 1.00 09/12/2018   FREET4 1.1 09/29/2017   FREET4 1.08 03/14/2017   FREET4 1.13 01/31/2017   FREET4 1.11 09/23/2016   FREET4 0.80 08/04/2016   FREET4 0.92 02/03/2016  08/21/2015: TSH 7.510, TT4 7.6  (0.45-4.5) (wellness screen at work)  Pt is on Synthroid d.a.w. 75 mcg daily, taken: - in am (at 7 AM)>> 1h later she takes [place] - fasting - at least 30 min from b'fast - no calcium - no iron - + multivitamins at lunchtime - no PPIs  Pt denies: - feeling nodules in neck - hoarseness - choking - SOB with lying down She has occasional dysphagia when eats too fast.  She has + FH of thyroid disorders in: father - hypothyroidism, maternal aunts and cousins. No FH of thyroid cancer. No h/o radiation tx to head or neck.  No herbal supplements. + B complex- last dose 2 weeks ago. No recent steroids use.   She also has a history of RA (on Plaquenil, Folic acid).  Previously on methotrexate.  On Xeljanz po, on which she has better mobility and less fatigue.  Reviewed together her latest bone density report ([facility]) -06/10/2021: LUMBAR SPINE:  The bone mineral density of the lumbar spine from L1 through L4 is 0.902 g/cm2.  T-score: -2.4  Z-score: -1.0  Omitted due to degenerative disease: None.  Comparison: No comparison.   PROXIMAL FEMUR:  The bone mineral density as measured at the left femoral neck region of interest is 0.726 g/cm2.  T-score: -2.2  Z-score: -0.9  Comparison: No comparison.   Calibrated bone mineral density of the distal left radius is 0.691 g/sq cm. This is 2.1 standard  deviations below the mean for young normals  The 10 year probability of a major osteoporotic fracture is 10.4%. 10 year probability of a hip fracture is 1.7%.  ROS: + see HPI  I reviewed pt's medications, allergies, PMH, social hx, family hx, and changes were documented in the history of present illness. Otherwise, unchanged from my initial visit note.  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, 02/2014,2020   Colposcopic biopsy proven   Rheumatoid arthritis(714.0) 2002   Past Surgical History:  Procedure Laterality Date   BREAST EXCISIONAL BIOPSY Left    BREAST SURGERY     Benign fibroid mass   CERVICAL BIOPSY  W/ LOOP ELECTRODE EXCISION  11/11   CIN II positive endocervical margin   COLONOSCOPY     FINGER SURGERY  2002   Social History   Social History   Marital Status: Married    Spouse Name: N/A   Number of Children: 2   Occupational History   Chemical engineer   Social History Main Topics   Smoking status: Never Smoker    Smokeless tobacco: Never Used   Alcohol Use: No   Drug Use: No   Sexual Activity:    Partners: Male    Pharmacist, hospital Protection: Other-see comments     Comment: vasectomy   Current Outpatient Medications on File Prior to Visit  Medication Sig Dispense Refill   alendronate (FOSAMAX) 70 MG tablet Take 1 tablet (70 mg total) by mouth every 7 (seven) days. Take with a full glass of water on an empty stomach. 12 tablet 4   leucovorin (WELLCOVORIN) 10 MG tablet Take 10 mg by mouth once a week.      methotrexate (RHEUMATREX) 2.5 MG tablet Take 20 mg by mouth once a week. Caution:Chemotherapy. Protect from light.      SYNTHROID 75 MCG tablet TAKE 1 TABLET BY MOUTH DAILY BEFORE BREAKFAST 90 tablet 2   Tofacitinib Citrate (XELJANZ) 5 MG TABS Take 5 mg by mouth 2 (two) times daily.      No current facility-administered medications on file prior to visit.   Allergies  Allergen Reactions   Codeine Nausea And Vomiting       Family History  Problem Relation Age of Onset   Cancer Father        COLON   Heart block Father    Breast cancer Neg Hx    PE: BP 128/80 (BP Location: Right Arm, Patient Position: Sitting, Cuff Size: Normal)   Pulse 78   Ht 5\' 2"  (1.575 m)   Wt 134 lb 12.8 oz (61.1 kg)   LMP 10/25/2007   SpO2 99%   BMI 24.66  kg/m  Body mass index is 24.66 kg/m. Wt Readings from Last 3 Encounters:  08/26/21 134 lb 12.8 oz (61.1 kg)  06/02/21 135 lb (61.2 kg)  08/21/20 138 lb (62.6 kg)   Constitutional: normal weight, in NAD Eyes: PERRLA, EOMI, no exophthalmos ENT: moist mucous membranes, no thyromegaly, no cervical lymphadenopathy Cardiovascular: RRR, No MRG Respiratory: CTA B Gastrointestinal: abdomen soft, NT, ND, BS+ Musculoskeletal: no deformities, strength intact in all 4 Skin: moist, warm, no rashes Neurological: no tremor with outstretched hands, DTR 3/4  in all 4  ASSESSMENT: 1. Hashimoto's Hypothyroidism  If no dose change >> 90 day supply to [facility] with 1 refill. If change in dose >> 90 day supply to [facility] with 0 refills.   2. Osteopenia  PLAN:  1. Patient with history of Hashimoto's hypothyroidism, on levothyroxine (brand name Synthroid). - latest thyroid labs reviewed with pt. >> normal: Lab Results  Component Value Date   TSH 0.91 08/21/2020  - she continues on LT4 75 mcg daily - pt feels good on this dose.  She continues to have fatigue, though, which is chronic for her.  At last visit, she was taking [place] along with Synthroid but we did not change this since her TFTs were normal. - we discussed about taking the thyroid hormone every day, with water, >30 minutes before breakfast, separated by >4 hours from acid reflux medications, calcium, iron, multivitamins. Pt. is taking it correctly. - will check thyroid tests today: TSH and fT4 - If labs are abnormal, she will need to return for repeat TFTs in 1.5 months -Otherwise, I will see her back in a year.  2. Osteopenia - no fractures -At this visit, upon her request, we also reviewed her bone density results from 06/10/2021.  She appears to have osteopenia.  She does remember being told that she had osteoporosis at the level of the forearm.  She will upload the full report and send this to me, as I do not have these results.  Per  review of the lumbar spine and left femoral neck bone densities, she does appear to have osteopenia. -We discussed that she is at high risk for fracture because of her rheumatoid arthritis -She has been suggested to take Fosamax and she was reticent due to the risk of osteonecrosis of the jaw.  We discussed that this is very rare, approximately 1 in 10,000-40,000 people.   -However, she remains reticent about starting medication for now.  That is why, we discussed about possibly repeating her bone density scan in 2 years from the previous and see how the T-scores change.  In the meantime, I advised her to make sure that her vitamin D level is normal (it was 43, normal, in 05/2021) on 4000 units vitamin D daily.  Also, we discussed about calcium supplementation.  She is taking calcium from multivitamin and I recommended that she aims to get 1200 mg of calcium daily from diet + supplements.  Discussed about foods that have more calcium. -I also recommended weightbearing exercises. -She agrees with the above plan to get another bone density (I explained that this has to be on the same machine for direct comparison) and then reevaluate the need for medication.  Component     Latest Ref Rng & Units 08/26/2021  T4,Free(Direct)     0.60 - 1.60 ng/dL 4.33  TSH     2.95 - 1.88 uIU/mL 0.36  Normal TFTs.  Carlus Pavlov, MD PhD [facility] Endocrinology

## 2021-09-06 ENCOUNTER — Other Ambulatory Visit (HOSPITAL_COMMUNITY)
Admission: RE | Admit: 2021-09-06 | Discharge: 2021-09-06 | Disposition: A | Discharge status: Home / Self Care | Payer: Managed Care, Other (non HMO) | Source: Ambulatory Visit | Attending: Obstetrics & Gynecology | Admitting: Obstetrics & Gynecology

## 2021-09-06 ENCOUNTER — Ambulatory Visit: Payer: Managed Care, Other (non HMO) | Admitting: Obstetrics & Gynecology

## 2021-09-06 ENCOUNTER — Other Ambulatory Visit: Payer: Self-pay

## 2021-09-06 ENCOUNTER — Encounter: Payer: Self-pay | Admitting: Obstetrics & Gynecology

## 2021-09-06 VITALS — BP 104/78 | HR 84 | Resp 18

## 2021-09-06 DIAGNOSIS — R87611 Atypical squamous cells cannot exclude high grade squamous intraepithelial lesion on cytologic smear of cervix (ASC-H): Secondary | ICD-10-CM

## 2021-09-06 DIAGNOSIS — M069 Rheumatoid arthritis, unspecified: Secondary | ICD-10-CM

## 2021-09-06 DIAGNOSIS — R87618 Other abnormal cytological findings on specimens from cervix uteri: Secondary | ICD-10-CM | POA: Insufficient documentation

## 2021-09-06 NOTE — Progress Notes (Addendum)
    Tiffany Jefferson Mar 11, 1961 [id number]        60 y.o.  G2P2L2 Married.  Husband with Asperger's Syndrome  RP: ASC-H with HPV HR Positive for Colposcopy  HPI: ASC-H with HPV HR Positive for Colposcopy.  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  Reviewed her chart thoroughly, no h/o LEEP found.  Never received the HPV immunization.  Immunosuppressed for RA.  Depression and frustration associated with Husband Dxed with Asperger's Syndrome who brought back that HR HPV a long time ago after a period of separation.  Patient will continue with Psychotherapy, changing therapist d/t insurance.  Will look into support for her husband given his Dx of Asperger's Syndrome.   OB History  Gravida Para Term Preterm AB Living  2 2       2   SAB IAB Ectopic Multiple Live Births               # Outcome Date GA Lbr Len/2nd Weight Sex Delivery Anes PTL Lv  2 Para           1 Para             Past medical history,surgical history, problem list, medications, allergies, family history and social history were all reviewed and documented in the EPIC chart.   Directed ROS with pertinent positives and negatives documented in the history of present illness/assessment and plan.  Exam:  Vitals:   09/06/21 1358  BP: 104/78  Pulse: 84  Resp: 18   General appearance:  Normal  Colposcopy Procedure Note Tiffany Jefferson 09/06/2021  Indications: ASC-H/HPV HR Pos  Procedure Details  The risks and benefits of the procedure and Written informed consent obtained.  Speculum placed in vagina and excellent visualization of cervix achieved, cervix swabbed x 3 with acetic acid solution.  Findings:  Cervix colposcopy: Physical Exam Genitourinary:        Vaginal colposcopy: Normal  Vulvar colposcopy: Normal  Perirectal colposcopy: Normal  The cervix was sprayed with Hurricane before performing the cervical biopsies.  Specimens: Cervical Bxs at 12 and 6 O'Clock  Complications: None,  hemostasis with Silver Nitrate . Plan: Management per Cervical Bx results   Assessment/Plan:  60 y.o. G2P2L2   1. Atypical squamous cells cannot exclude high grade squamous intraepithelial lesion on cytologic smear of cervix (ASC-H) Longstanding history of CIN-1.  Last Pap test showed ASC-H with high risk HPV positive.  Long history of CIN-1.  Patient is familiar with a colposcopy procedure.  HPV 16-18-45 were negative in 2017, will repeat today.  Colposcopy findings reviewed with [facility].  Management per cervical biopsy results and HPV 16-18-45 results.  The possibility of doing a LEEP procedure discussed.  Postprocedure precautions reviewed. - Colposcopy - Surgical pathology( [place]/ [facility])  2. Pap smear abnormality of cervix/human papillomavirus (HPV) positive HPV 16-18-45 tested today. - Colposcopy - Cervicovaginal ancillary only( [place])  3. Rheumatoid arthritis involving multiple sites, unspecified whether rheumatoid factor present (HCC)  Immunosuppressed on Methotrexate and Tofacitinib for RA.  07-17-1993 MD, 2:15 PM 09/06/2021

## 2021-09-06 NOTE — Addendum Note (Signed)
Addended by: Genia Del on: 09/06/2021 05:03 PM   Modules accepted: Orders

## 2021-09-08 LAB — SURGICAL PATHOLOGY

## 2021-09-10 LAB — CERVICOVAGINAL ANCILLARY ONLY
Comment: NEGATIVE
HPV 16: NEGATIVE
HPV 18 / 45: NEGATIVE

## 2021-09-11 ENCOUNTER — Encounter: Payer: Self-pay | Admitting: Obstetrics & Gynecology

## 2021-09-13 ENCOUNTER — Other Ambulatory Visit: Payer: Self-pay

## 2021-09-13 MED ORDER — ACYCLOVIR 5 % EX OINT: 1.0000 "application " | TOPICAL_OINTMENT | CUTANEOUS | 1 refills | Status: DC

## 2021-09-13 MED ORDER — VALACYCLOVIR HCL 500 MG PO TABS: ORAL_TABLET | ORAL | 3 refills | Status: DC

## 2021-09-13 NOTE — Telephone Encounter (Signed)
Dr. Seymour Bars, please advise regarding strength and dosing for Acyclovir caps.  Thanks

## 2021-09-15 ENCOUNTER — Telehealth: Payer: Self-pay | Admitting: *Deleted

## 2021-09-15 NOTE — Telephone Encounter (Signed)
[facility] sent fax for PA for acyclovir ointment 5% done via [facility]. Will wait for response from [facility].

## 2021-09-23 NOTE — Telephone Encounter (Signed)
[employer] denied acyclovir 5% ointment patient will need to try and fail acyclovir capsules,(200 mg or 400 mg tablet) oral suspension 200 mg/40mL.   My chart message sent to patient informing her of this.

## 2021-09-24 ENCOUNTER — Other Ambulatory Visit: Payer: Self-pay | Admitting: *Deleted

## 2021-09-24 DIAGNOSIS — D069 Carcinoma in situ of cervix, unspecified: Secondary | ICD-10-CM

## 2021-11-17 ENCOUNTER — Ambulatory Visit: Payer: Managed Care, Other (non HMO) | Admitting: Obstetrics & Gynecology

## 2021-12-06 ENCOUNTER — Encounter: Payer: Self-pay | Admitting: Internal Medicine

## 2021-12-07 ENCOUNTER — Telehealth: Payer: Self-pay | Admitting: Pharmacy Technician

## 2021-12-07 ENCOUNTER — Other Ambulatory Visit (HOSPITAL_COMMUNITY): Payer: Self-pay

## 2021-12-07 NOTE — Telephone Encounter (Signed)
Patient Advocate Encounter  Received notification from [employer] [facility]) that prior authorization for SYNTHROID is required.   PA submitted on 2.14.23 Key [id number] Status is pending   [facility] will continue to follow  Ricke Hey, CPhT Patient Advocate [facility] Phone: [phone] Fax:  [phone]

## 2021-12-07 NOTE — Telephone Encounter (Signed)
PA has been submitted. Will continue to follow for determination.

## 2021-12-08 ENCOUNTER — Other Ambulatory Visit (HOSPITAL_COMMUNITY): Payer: Self-pay

## 2021-12-08 NOTE — Telephone Encounter (Signed)
PA has been approved #90/90 days return copay of $99. Will update chart momentarily

## 2021-12-09 ENCOUNTER — Other Ambulatory Visit (HOSPITAL_COMMUNITY): Payer: Self-pay

## 2021-12-09 ENCOUNTER — Encounter: Payer: Self-pay | Admitting: Internal Medicine

## 2021-12-13 ENCOUNTER — Other Ambulatory Visit (HOSPITAL_COMMUNITY): Payer: Self-pay

## 2021-12-17 ENCOUNTER — Encounter: Payer: Self-pay | Admitting: Internal Medicine

## 2021-12-20 ENCOUNTER — Encounter: Payer: Self-pay | Admitting: Obstetrics & Gynecology

## 2021-12-20 ENCOUNTER — Encounter: Payer: Self-pay | Admitting: Internal Medicine

## 2021-12-20 ENCOUNTER — Other Ambulatory Visit (HOSPITAL_COMMUNITY): Payer: Self-pay

## 2021-12-20 NOTE — Telephone Encounter (Signed)
I am working on this request via phone with [place]. Will follow up with an update.

## 2021-12-21 ENCOUNTER — Other Ambulatory Visit: Payer: Self-pay

## 2021-12-21 DIAGNOSIS — M059 Rheumatoid arthritis with rheumatoid factor, unspecified: Secondary | ICD-10-CM

## 2021-12-21 NOTE — Telephone Encounter (Signed)
Her rheumatologist recommends every  3 months patient to have labs drawn due to rheumatoid arthritis. Patient has done this before in the past at this office, once labs return her rheumatologist he will make the recommendations. Patient typically has it drawn here because it is located in [place]. If you are okay with this I will place labs orders in and schedule lab appointment for patient.    Orders:  Albumin                ALT                AST                Creatinine, serum                CBC w diff

## 2021-12-21 NOTE — Telephone Encounter (Signed)
Pt lvm advising the reason for the denial was due to the documentation that was submitted or lack of documentation.

## 2021-12-28 ENCOUNTER — Other Ambulatory Visit: Payer: Self-pay

## 2021-12-28 ENCOUNTER — Other Ambulatory Visit: Payer: Managed Care, Other (non HMO)

## 2021-12-28 DIAGNOSIS — M059 Rheumatoid arthritis with rheumatoid factor, unspecified: Secondary | ICD-10-CM

## 2021-12-29 LAB — CBC WITH DIFFERENTIAL/PLATELET
Absolute Monocytes: 564 cells/uL (ref 200–950)
Basophils Absolute: 68 cells/uL (ref 0–200)
Basophils Relative: 1 %
Eosinophils Absolute: 41 cells/uL (ref 15–500)
Eosinophils Relative: 0.6 %
HCT: 40.8 % (ref 35.0–45.0)
Hemoglobin: 13.9 g/dL (ref 11.7–15.5)
Lymphs Abs: 1367 cells/uL (ref 850–3900)
MCH: 33 pg (ref 27.0–33.0)
MCHC: 34.1 g/dL (ref 32.0–36.0)
MCV: 96.9 fL (ref 80.0–100.0)
MPV: 12.3 fL (ref 7.5–12.5)
Monocytes Relative: 8.3 %
Neutro Abs: 4760 cells/uL (ref 1500–7800)
Neutrophils Relative %: 70 %
Platelets: 258 10*3/uL (ref 140–400)
RBC: 4.21 10*6/uL (ref 3.80–5.10)
RDW: 12.4 % (ref 11.0–15.0)
Total Lymphocyte: 20.1 %
WBC: 6.8 10*3/uL (ref 3.8–10.8)

## 2021-12-29 LAB — AST: AST: 18 U/L (ref 10–35)

## 2021-12-29 LAB — ALT: ALT: 16 U/L (ref 6–29)

## 2021-12-29 LAB — ALBUMIN: Albumin: 4.5 g/dL (ref 3.6–5.1)

## 2021-12-29 LAB — CREATININE, SERUM: Creat: 0.84 mg/dL (ref 0.50–1.05)

## 2022-02-09 ENCOUNTER — Other Ambulatory Visit: Payer: Self-pay | Admitting: Internal Medicine

## 2022-02-09 ENCOUNTER — Encounter: Payer: Self-pay | Admitting: Internal Medicine

## 2022-02-09 DIAGNOSIS — E038 Other specified hypothyroidism: Secondary | ICD-10-CM

## 2022-02-10 MED ORDER — LEVOTHYROXINE SODIUM 75 MCG PO TABS: 75.0000 ug | ORAL_TABLET | Freq: Every day | ORAL | 1 refills | Status: DC

## 2022-06-03 ENCOUNTER — Encounter: Payer: Self-pay | Admitting: Obstetrics & Gynecology

## 2022-06-03 DIAGNOSIS — M05742 Rheumatoid arthritis with rheumatoid factor of left hand without organ or systems involvement: Secondary | ICD-10-CM

## 2022-06-03 DIAGNOSIS — M05741 Rheumatoid arthritis with rheumatoid factor of right hand without organ or systems involvement: Secondary | ICD-10-CM

## 2022-06-03 NOTE — Telephone Encounter (Signed)
Okay to order labs?

## 2022-06-07 ENCOUNTER — Other Ambulatory Visit: Payer: Managed Care, Other (non HMO)

## 2022-06-07 DIAGNOSIS — M05741 Rheumatoid arthritis with rheumatoid factor of right hand without organ or systems involvement: Secondary | ICD-10-CM

## 2022-06-08 ENCOUNTER — Telehealth: Payer: Self-pay

## 2022-06-08 ENCOUNTER — Other Ambulatory Visit: Payer: Self-pay

## 2022-06-08 DIAGNOSIS — M05741 Rheumatoid arthritis with rheumatoid factor of right hand without organ or systems involvement: Secondary | ICD-10-CM

## 2022-06-08 DIAGNOSIS — Z79899 Other long term (current) drug therapy: Secondary | ICD-10-CM

## 2022-06-08 LAB — CBC
HCT: 38.5 % (ref 35.0–45.0)
Hemoglobin: 13.4 g/dL (ref 11.7–15.5)
MCH: 33.4 pg — ABNORMAL HIGH (ref 27.0–33.0)
MCHC: 34.8 g/dL (ref 32.0–36.0)
MCV: 96 fL (ref 80.0–100.0)
MPV: 12.2 fL (ref 7.5–12.5)
Platelets: 253 10*3/uL (ref 140–400)
RBC: 4.01 10*6/uL (ref 3.80–5.10)
RDW: 12 % (ref 11.0–15.0)
WBC: 6.5 10*3/uL (ref 3.8–10.8)

## 2022-06-08 LAB — ALBUMIN: Albumin: 4.2 g/dL (ref 3.6–5.1)

## 2022-06-08 LAB — AST: AST: 14 U/L (ref 10–35)

## 2022-06-08 LAB — CREATININE, SERUM: Creat: 1.11 mg/dL — ABNORMAL HIGH (ref 0.50–1.05)

## 2022-06-08 LAB — ALT: ALT: 14 U/L (ref 6–29)

## 2022-06-08 NOTE — Telephone Encounter (Signed)
I spoke with Dr. Seymour Bars about patient needing labs for her [facility] rheumatologist every 3 months and if she would okay standing order so that we can order and schedule lab appointment as needed. Dr. Seymour Bars said this is fine with her.  Labs: Albumin M05.741, M05.742  ALT AST CBC Creatinine  Dx codes for all labs are same: Rheumatoid arthritis involving both hands with pos rheumatoid factor  M05.741, M05.742  Long term use of high risk medication Z79.899

## 2022-06-08 NOTE — Telephone Encounter (Signed)
At patient's request new orders have been placed for 09/07/22 lab appointment.

## 2022-08-25 ENCOUNTER — Encounter: Payer: Self-pay | Admitting: Internal Medicine

## 2022-08-25 ENCOUNTER — Ambulatory Visit: Payer: Managed Care, Other (non HMO) | Admitting: Internal Medicine

## 2022-08-25 VITALS — BP 118/84 | HR 99 | Ht 62.0 in | Wt 134.2 lb

## 2022-08-25 DIAGNOSIS — M8589 Other specified disorders of bone density and structure, multiple sites: Secondary | ICD-10-CM | POA: Diagnosis not present

## 2022-08-25 DIAGNOSIS — E038 Other specified hypothyroidism: Secondary | ICD-10-CM

## 2022-08-25 DIAGNOSIS — E063 Autoimmune thyroiditis: Secondary | ICD-10-CM | POA: Diagnosis not present

## 2022-08-25 LAB — VITAMIN D 25 HYDROXY (VIT D DEFICIENCY, FRACTURES): VITD: 26.83 ng/mL — ABNORMAL LOW (ref 30.00–100.00)

## 2022-08-25 LAB — TSH: TSH: 1.11 u[IU]/mL (ref 0.35–5.50)

## 2022-08-25 LAB — T4, FREE: Free T4: 1.09 ng/dL (ref 0.60–1.60)

## 2022-08-25 NOTE — Progress Notes (Signed)
Patient ID: Tiffany Jefferson, female   DOB: 06-21-1961, 61 y.o.   MRN: [id number]  HPI  Tiffany Jefferson is a 61 y.o.-year-old female, initially referred by her ObGyn Dr., Dr. Audie Box, returning for follow-up for hypothyroidism 2/2 Hashimoto's thyroiditis and osteopenia. Last visit 1 year ago.  Interim hx: No fractures since last visit. No dizziness/vertigo/vision problems.  Reviewed history: Pt. has had a high TSH since ~2013; we started Levothyroxine 25 mcg daily in 09/2015.  We increased the dose since then. We switched to brand name Synthroid for more consistent dosing. She pays 120$ for 3 mo.  Reviewed her TFTs: Lab Results  Component Value Date   TSH 0.36 08/26/2021   TSH 0.91 08/21/2020   TSH 0.72 08/20/2019   TSH 0.95 10/26/2018   TSH 3.19 09/12/2018   TSH 4.15 09/29/2017   TSH 3.13 03/14/2017   TSH 0.59 01/31/2017   TSH 0.36 09/23/2016   TSH 4.79 (H) 08/04/2016   FREET4 1.07 08/26/2021   FREET4 1.22 08/21/2020   FREET4 1.15 08/20/2019   FREET4 1.08 10/26/2018   FREET4 1.00 09/12/2018   FREET4 1.1 09/29/2017   FREET4 1.08 03/14/2017   FREET4 1.13 01/31/2017   FREET4 1.11 09/23/2016   FREET4 0.80 08/04/2016  08/21/2015: TSH 7.510, TT4 7.6 (0.45-4.5) (wellness screen at work)  Pt is on Synthroid d.a.w. 75 mcg daily, taken: - in am (at 7 AM)>> 1h later she takes [place] - fasting - at least 30 min from b'fast - no calcium - no iron - + multivitamins at lunchtime - intermittently - no PPIs  Pt denies: - feeling nodules in neck - hoarseness - choking She has occasional dysphagia when eats too fast or (Basmati) rice.  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  She has + FH of thyroid disorders in: father - hypothyroidism, maternal aunts and cousins. No FH of thyroid cancer. No h/o radiation tx to head or neck.  No herbal supplements. + B complex - not in weeks. No recent steroids use.   She also has a history of RA (on Plaquenil, Folic acid).   Previously on methotrexate.  On Xeljanz po, on which she has better mobility and less fatigue.  Reviewed  her latest bone density report ([facility]) -06/10/2021: LUMBAR SPINE:  The bone mineral density of the lumbar spine from L1 through L4 is 0.902 g/cm2.  T-score: -2.4  Z-score: -1.0  Omitted due to degenerative disease: None.  Comparison: No comparison.   PROXIMAL FEMUR:  The bone mineral density as measured at the left femoral neck region of interest is 0.726 g/cm2.  T-score: -2.2  Z-score: -0.9  Comparison: No comparison.   Calibrated bone mineral density of the distal left radius is 0.691 g/sq cm. This is 2.1 standard deviations below the mean for young normals  The 10 year probability of a major osteoporotic fracture is 10.4%. 10 year probability of a hip fracture is 1.7%.  Rheumatologist suggested Reclast >> not covered. ObGyn suggested Fosamax >> not started as she was concerned about possible SEs.  ROS: + see HPI  I reviewed pt's medications, allergies, PMH, social hx, family hx, and changes were documented in the history of present illness. Otherwise, unchanged from my initial visit note.  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, 02/2014,2020   Colposcopic biopsy proven   Rheumatoid arthritis(714.0) 2002   Past Surgical History:  Procedure Laterality Date   BREAST EXCISIONAL BIOPSY Left    BREAST SURGERY     Benign fibroid mass   CERVICAL BIOPSY  W/ LOOP ELECTRODE EXCISION  11/11   CIN II positive endocervical margin   COLONOSCOPY     FINGER SURGERY  2002   Social History   Social History   Marital Status: Married    Spouse Name: N/A   Number of  Children: 2   Occupational History   Higher education careers adviser   Social History Main Topics   Smoking status: Never Smoker    Smokeless tobacco: Never Used   Alcohol Use: No   Drug Use: No   Sexual Activity:    Partners: Male    Patent examiner Protection: Other-see comments     Comment: vasectomy   Current Outpatient Medications on File Prior to Visit  Medication Sig Dispense Refill   acyclovir ointment (ZOVIRAX) 5 % Apply 1 application topically every 3 (three) hours. 30 g 1   leucovorin (WELLCOVORIN) 10 MG tablet Take 10 mg by mouth once a week.      levothyroxine (SYNTHROID) 75 MCG tablet Take 1 tablet (75 mcg total) by mouth daily. 90 tablet 1   methotrexate (RHEUMATREX) 2.5 MG tablet Take 20 mg by mouth once a week. Caution:Chemotherapy. Protect from light.      Tofacitinib Citrate (XELJANZ) 5 MG TABS Take 5 mg by mouth 2 (two) times daily.      valACYclovir (VALTREX) 500 MG tablet Take two tabs po daily x 5 days. 10 tablet 3   No current facility-administered medications on file prior to visit.   Allergies  Allergen Reactions   Codeine Nausea And Vomiting       Family History  Problem Relation Age of Onset   Cancer Father        COLON   Heart block Father    Breast cancer Neg Hx    PE: BP 118/84 (BP Location: Right Arm, Patient Position: Sitting, Cuff Size: Normal)   Pulse 99   Ht 5\' 2"  (1.575 m)   Wt 134 lb 3.2 oz (60.9 kg)   LMP 10/25/2007   SpO2 99%   BMI 24.55 kg/m   Wt Readings from Last 3 Encounters:  08/25/22 134 lb 3.2 oz (60.9 kg)  08/26/21 134 lb 12.8 oz (61.1 kg)  06/02/21 135 lb (61.2 kg)   Constitutional: normal weight, in NAD Eyes:  EOMI, no exophthalmos ENT: no neck masses, no cervical lymphadenopathy Cardiovascular: RRR, No MRG Respiratory: CTA B Musculoskeletal: no deformities Skin:no rashes Neurological: no tremor with outstretched hands  ASSESSMENT: 1. Hashimoto's Hypothyroidism  If no dose change >> 90 day supply to [facility] with 1  refill. If change in dose >> 90 day supply to [facility] with 0 refills.   2. Osteopenia  PLAN:  1. Patient with history of Hashimoto's hypothyroidism, on brand-name Synthroid - latest thyroid labs reviewed with pt. >> normal: Lab Results  Component Value Date   TSH 0.36 08/26/2021  - she continues on LT4 DAW 75 mcg daily -insurance does not cover this, so this is quite expensive.  I advised her to look into the Synthroid Delivers program.  She will let me know if she wants the new refill sent to [facility] in [place]. - pt feels good on  this dose. - we discussed about taking the thyroid hormone every day, with water, >30 minutes before breakfast, separated by >4 hours from acid reflux medications, calcium, iron, multivitamins. Pt. is taking it correctly.  She is taking [place] along with Synthroid but we did not change this since her TFTs were normal. - will check thyroid tests today: TSH and fT4 - If labs are abnormal, she will need to return for repeat TFTs in 1.5 months -Otherwise, I will see her back in a year.  2. Osteopenia -Addressed per her request -No falls or fractures since last visit -At last visit, upon her request, we reviewed her bone density results from 06/10/2021 ([facility]).  This showed osteopenia.  She did not remember being told that she had osteoporosis at the level of the forearm.  I do not have these results. -We discussed at that time and again today that she was at higher risk for fracture especially because of her rheumatoid arthritis. She was recommended Reclast by her rheumatologist but this was not covered by insurance.  OB/GYN suggested  Fosamax  but she was reticent to start this due to the risk of osteonecrosis of the jaw.  We again discussed that this is very rare, approximately 1 in 10,000-40,000 people.  She remains reticent about starting the medication.  However, we discussed about getting another bone density scan in 2 years from the previous, and in the  meantime, continue vitamin D 4000 units daily since on this dose vitamin D level was normal in 05/2021 -we will recheck this today.   -At last visit, I recommended to get 1200 mg of calcium daily from the diet + supplements.  I also recommended weightbearing exercises. -She is due for another bone density scan after 06/11/2023 -I advised her that if she has this before next visit, we can review it then.  We discussed that we need to make sure that her FRAX score is adjusted to the presence of RA.  Component     Latest Ref Rng 08/25/2022  T4,Free(Direct)     0.60 - 1.60 ng/dL 5.68   TSH     1.27 - 5.17 uIU/mL 1.11   VITD     30.00 - 100.00 ng/mL 26.83 (L)     Vitamin D level is still slightly low.  I will check with her if she is taking her vitamin D supplement daily.  Carlus Pavlov, MD PhD [facility] Endocrinology

## 2022-08-25 NOTE — Patient Instructions (Addendum)
Please stop at the lab.  Please continue 75 mcg daily of Synthroid.  Take the thyroid hormone every day, with water, at least 30 minutes before breakfast, separated by at least 4 hours from: - acid reflux medications - calcium - iron - multivitamins  Please cal [facility]. To give them your info.  Please come back for a follow-up appointment in 1 year.

## 2022-08-26 ENCOUNTER — Ambulatory Visit: Payer: Managed Care, Other (non HMO) | Admitting: Internal Medicine

## 2022-08-26 ENCOUNTER — Other Ambulatory Visit: Payer: Self-pay | Admitting: Internal Medicine

## 2022-08-26 MED ORDER — SYNTHROID 75 MCG PO TABS: 75.0000 ug | ORAL_TABLET | Freq: Every day | ORAL | 3 refills | Status: DC

## 2022-09-03 ENCOUNTER — Other Ambulatory Visit: Payer: Self-pay | Admitting: Internal Medicine

## 2022-09-03 DIAGNOSIS — E038 Other specified hypothyroidism: Secondary | ICD-10-CM

## 2022-09-07 ENCOUNTER — Other Ambulatory Visit: Payer: Managed Care, Other (non HMO)

## 2022-09-07 DIAGNOSIS — M05741 Rheumatoid arthritis with rheumatoid factor of right hand without organ or systems involvement: Secondary | ICD-10-CM

## 2022-09-07 DIAGNOSIS — Z79899 Other long term (current) drug therapy: Secondary | ICD-10-CM

## 2022-09-08 LAB — CBC WITH DIFFERENTIAL/PLATELET
Absolute Monocytes: 624 cells/uL (ref 200–950)
Basophils Absolute: 47 cells/uL (ref 0–200)
Basophils Relative: 0.6 %
Eosinophils Absolute: 47 cells/uL (ref 15–500)
Eosinophils Relative: 0.6 %
HCT: 38.3 % (ref 35.0–45.0)
Hemoglobin: 13.2 g/dL (ref 11.7–15.5)
Lymphs Abs: 1248 cells/uL (ref 850–3900)
MCH: 33.2 pg — ABNORMAL HIGH (ref 27.0–33.0)
MCHC: 34.5 g/dL (ref 32.0–36.0)
MCV: 96.5 fL (ref 80.0–100.0)
MPV: 12.2 fL (ref 7.5–12.5)
Monocytes Relative: 8 %
Neutro Abs: 5834 cells/uL (ref 1500–7800)
Neutrophils Relative %: 74.8 %
Platelets: 230 10*3/uL (ref 140–400)
RBC: 3.97 10*6/uL (ref 3.80–5.10)
RDW: 12.1 % (ref 11.0–15.0)
Total Lymphocyte: 16 %
WBC: 7.8 10*3/uL (ref 3.8–10.8)

## 2022-09-08 LAB — AST: AST: 18 U/L (ref 10–35)

## 2022-09-08 LAB — ALBUMIN: Albumin: 4.2 g/dL (ref 3.6–5.1)

## 2022-09-08 LAB — CREATININE, SERUM: Creat: 0.85 mg/dL (ref 0.50–1.05)

## 2022-09-08 LAB — ALT: ALT: 19 U/L (ref 6–29)

## 2022-11-30 ENCOUNTER — Telehealth: Payer: Self-pay

## 2022-11-30 ENCOUNTER — Other Ambulatory Visit: Payer: Managed Care, Other (non HMO)

## 2022-11-30 DIAGNOSIS — M057A Rheumatoid arthritis with rheumatoid factor of other specified site without organ or systems involvement: Secondary | ICD-10-CM

## 2022-11-30 DIAGNOSIS — M05741 Rheumatoid arthritis with rheumatoid factor of right hand without organ or systems involvement: Secondary | ICD-10-CM

## 2022-11-30 NOTE — Telephone Encounter (Signed)
I realized after I sent this message to Tiffany that Dr. Marguerita Merles gave okay to standing orders for these labs.  I will go ahead and place the orders for [facility]. I did not read my last note carefully enough.

## 2022-11-30 NOTE — Telephone Encounter (Signed)
Orders placed and [facility] notified.

## 2022-11-30 NOTE — Telephone Encounter (Signed)
Patient has been coming here for quite a few years and having labs drawn for her rheumatologist who is out of town. Dr. Phineas Real ordered them and currently Dr. Dellis Filbert orders them.  Patient usually calls ahead and we check with MD and have orders placed for her lab visit.  She scheduled lab visit and we were unaware she was coming. She showed up today. [name] went ahead and drew her blood but I need permission to place these lab orders for her. Ok?   See my note from 06/08/2022.   " spoke with Dr. Dellis Filbert about patient needing labs for her [facility] rheumatologist every 3 months and if she would okay standing order so that we can order and schedule lab appointment as needed. Dr. Dellis Filbert said this is fine with her.   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

## 2022-12-01 ENCOUNTER — Other Ambulatory Visit: Payer: Managed Care, Other (non HMO)

## 2022-12-01 DIAGNOSIS — M05741 Rheumatoid arthritis with rheumatoid factor of right hand without organ or systems involvement: Secondary | ICD-10-CM

## 2022-12-01 NOTE — Telephone Encounter (Signed)
Spoke with patient and confirmed with her that I do have note from 05/2022 where Dr. Marguerita Merles gave okay for standing orders.  I asked her if she will just call us a few days before and let us know she is coming so we can place her lab orders so when she arrives for lab appt the orders are in for the lab.  She agrees.

## 2022-12-02 LAB — CBC WITH DIFFERENTIAL/PLATELET
Absolute Monocytes: 462 cells/uL (ref 200–950)
Basophils Absolute: 73 cells/uL (ref 0–200)
Basophils Relative: 1.1 %
Eosinophils Absolute: 73 cells/uL (ref 15–500)
Eosinophils Relative: 1.1 %
HCT: 38.9 % (ref 35.0–45.0)
Hemoglobin: 13.4 g/dL (ref 11.7–15.5)
Lymphs Abs: 1868 cells/uL (ref 850–3900)
MCH: 33.2 pg — ABNORMAL HIGH (ref 27.0–33.0)
MCHC: 34.4 g/dL (ref 32.0–36.0)
MCV: 96.3 fL (ref 80.0–100.0)
MPV: 12.2 fL (ref 7.5–12.5)
Monocytes Relative: 7 %
Neutro Abs: 4125 cells/uL (ref 1500–7800)
Neutrophils Relative %: 62.5 %
Platelets: 255 10*3/uL (ref 140–400)
RBC: 4.04 10*6/uL (ref 3.80–5.10)
RDW: 12.5 % (ref 11.0–15.0)
Total Lymphocyte: 28.3 %
WBC: 6.6 10*3/uL (ref 3.8–10.8)

## 2022-12-02 LAB — ALBUMIN: Albumin: 4.2 g/dL (ref 3.6–5.1)

## 2022-12-02 LAB — ALT: ALT: 28 U/L (ref 6–29)

## 2022-12-02 LAB — CREATININE, SERUM: Creat: 0.84 mg/dL (ref 0.50–1.05)

## 2022-12-02 LAB — AST: AST: 22 U/L (ref 10–35)

## 2022-12-05 ENCOUNTER — Other Ambulatory Visit: Payer: Managed Care, Other (non HMO)

## 2022-12-08 ENCOUNTER — Other Ambulatory Visit: Payer: Managed Care, Other (non HMO)

## 2023-02-20 ENCOUNTER — Telehealth: Payer: Self-pay

## 2023-02-20 DIAGNOSIS — M05742 Rheumatoid arthritis with rheumatoid factor of left hand without organ or systems involvement: Secondary | ICD-10-CM

## 2023-02-20 NOTE — Telephone Encounter (Signed)
Patient called because it is time for labwork (every 3 months). We have worked with her for several years allowing her to have her labs drawn in our office for her Rheumatologist, Dr. Vita Barley, who is at [facility]. He can review results in Epic. Dr. Mackey Birchwood gave standing ok last year.   Ok to place orders for her?  Orders :  AST, ALT, ALBUMIN, CREATININE, CBC/ ICD 10 code: M05.9   I noticed today when in her chart:  She is past due for AEX  Last AEX 06/02/2021 Had a Colpo 09/06/2021. LEEP was advised and she was scheduled for 11/14/2021 but cancelled that appointment.

## 2023-02-21 NOTE — Telephone Encounter (Signed)
My Chart message sent informing patient and asking her if I can have appt desk reach out to schedule her.

## 2023-02-21 NOTE — Telephone Encounter (Signed)
  Urgent to schedule Annual exam with Pap/HPV HR.  We can do the labs the same day. Thanks, Dr Elbert Ewings

## 2023-02-22 NOTE — Telephone Encounter (Signed)
Tiffany Del, MD  You7 minutes ago (11:26 AM)   Yes, thank you. Dr Elbert Ewings

## 2023-02-22 NOTE — Telephone Encounter (Signed)
Dr. Seymour Bars, patient is coming in on Friday morning at 10:30am for AEX/PapR/HPV. She will have labs for Rheumatologist drawn then.  Would you like me to go ahead and place future orders for those labs?

## 2023-02-22 NOTE — Telephone Encounter (Signed)
Lab orders placed as future orders for Friday.  CBC ALT AST  Almumin Creatinine  ICD 10 code for all tests is M05.741

## 2023-02-22 NOTE — Telephone Encounter (Signed)
I called and spoke with patient and informed her of My Chart message. She is agreeable to urgent AEX this week if possible so she can have labs. Message sent to appt desk.

## 2023-02-24 ENCOUNTER — Ambulatory Visit: Payer: Managed Care, Other (non HMO) | Admitting: Obstetrics & Gynecology

## 2023-03-16 ENCOUNTER — Encounter: Payer: Self-pay | Admitting: Obstetrics & Gynecology

## 2023-03-16 ENCOUNTER — Ambulatory Visit (INDEPENDENT_AMBULATORY_CARE_PROVIDER_SITE_OTHER): Payer: Managed Care, Other (non HMO) | Admitting: Obstetrics & Gynecology

## 2023-03-16 ENCOUNTER — Other Ambulatory Visit (HOSPITAL_COMMUNITY)
Admission: RE | Admit: 2023-03-16 | Discharge: 2023-03-16 | Disposition: A | Discharge status: Home / Self Care | Payer: Managed Care, Other (non HMO) | Source: Ambulatory Visit | Attending: Obstetrics & Gynecology | Admitting: Obstetrics & Gynecology

## 2023-03-16 VITALS — BP 120/78 | HR 81 | Ht 61.75 in | Wt 138.0 lb

## 2023-03-16 DIAGNOSIS — D069 Carcinoma in situ of cervix, unspecified: Secondary | ICD-10-CM | POA: Insufficient documentation

## 2023-03-16 DIAGNOSIS — M05741 Rheumatoid arthritis with rheumatoid factor of right hand without organ or systems involvement: Secondary | ICD-10-CM

## 2023-03-16 DIAGNOSIS — Z01419 Encounter for gynecological examination (general) (routine) without abnormal findings: Secondary | ICD-10-CM

## 2023-03-16 DIAGNOSIS — Z780 Asymptomatic menopausal state: Secondary | ICD-10-CM | POA: Diagnosis not present

## 2023-03-16 DIAGNOSIS — M8589 Other specified disorders of bone density and structure, multiple sites: Secondary | ICD-10-CM

## 2023-03-16 DIAGNOSIS — M05742 Rheumatoid arthritis with rheumatoid factor of left hand without organ or systems involvement: Secondary | ICD-10-CM

## 2023-03-16 MED ORDER — VALACYCLOVIR HCL 500 MG PO TABS: ORAL_TABLET | ORAL | 4 refills | Status: AC

## 2023-03-16 NOTE — Progress Notes (Signed)
Tiffany Jefferson 06-14-1961 [id number]   History:    62 y.o. G2P2L2 Married  RP:  Established patient presenting for annual gyn exam   HPI: ASC-H with HPV HR Positive on 06/02/2021. Colpo 09/06/2021 Severe Cervical Dysplasia (CIN-3).  Patient was informed at that time that she needed a LEEP procedure.  Refused to schedule and didn't f/u until today.  Pap/HPV HR today. 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 Reviewed her chart thoroughly, no h/o LEEP found. Never received the HPV immunization. Immunosuppressed because of RA treatments.  Valacyclovir for recurrences of HSV.  C/O a bulge in the vagina, especially when her bladder is full.  No pain.  No SUI. Breasts normal. Mammo Neg 06/2022.  BMI 05/2021 Osteopenia, repeat BD 05/2023.  Colono 03/2016. Depression and frustration associated with Husband Dxed with Asperger's Syndrome who brought back that HR HPV a long time ago after a period of separation. Patient will continue with Psychotherapy.  Recommend Psychiatry with Anti-Depressive treatment.    Pt request labwork for RA and Vit D level.    Past medical history,surgical history, family history and social history were all reviewed and documented in the EPIC chart.  Gynecologic History Patient's last menstrual period was 10/25/2007.  Obstetric History OB History  Gravida Para Term Preterm AB Living  2 2 2     2   SAB IAB Ectopic Multiple Live Births               # Outcome Date GA Lbr Len/2nd Weight Sex Delivery Anes PTL Lv  2 Term           1 Term              ROS: A ROS was performed and pertinent positives and negatives are included in the history. GENERAL: No fevers or chills. HEENT: No change in vision, no earache, sore throat or sinus congestion. NECK: No pain or stiffness. CARDIOVASCULAR: No chest pain or pressure. No palpitations. PULMONARY: No shortness of breath, cough or wheeze. GASTROINTESTINAL: No abdominal pain, nausea, vomiting or diarrhea, melena or bright red blood  per rectum. GENITOURINARY: No urinary frequency, urgency, hesitancy or dysuria. MUSCULOSKELETAL: No joint or muscle pain, no back pain, no recent trauma. DERMATOLOGIC: No rash, no itching, no lesions. ENDOCRINE: No polyuria, polydipsia, no heat or cold intolerance. No recent change in weight. HEMATOLOGICAL: No anemia or easy bruising or bleeding. NEUROLOGIC: No headache, seizures, numbness, tingling or weakness. PSYCHIATRIC: No depression, no loss of interest in normal activity or change in sleep pattern.     Exam:   BP 120/78   Pulse 81   Ht 5' 1.75" (1.568 m)   Wt 138 lb (62.6 kg)   LMP 10/25/2007 Comment: sexually active  SpO2 98%   BMI 25.45 kg/m   Body mass index is 25.45 kg/m.  General appearance : Well developed well nourished female. No acute distress HEENT: Eyes: no retinal hemorrhage or exudates,  Neck supple, trachea midline, no carotid bruits, no thyroidmegaly Lungs: Clear to auscultation, no rhonchi or wheezes, or rib retractions  Heart: Regular rate and rhythm, no murmurs or gallops Breast:Examined in sitting and supine position were symmetrical in appearance, no palpable masses or tenderness,  no skin retraction, no nipple inversion, no nipple discharge, no skin discoloration, no axillary or supraclavicular lymphadenopathy Abdomen: no palpable masses or tenderness, no rebound or guarding Extremities: no edema or skin discoloration or tenderness  Pelvic: Vulva: Normal  Vagina: No gross lesions or discharge  Cervix: No gross lesions or discharge.  Pap/HPV HR done.  Uterus  AV, normal size, shape and consistency, non-tender and mobile  Adnexa  Without masses or tenderness  Anus: Normal   Assessment/Plan:  62 y.o. female for annual exam   1. Encounter for routine gynecological examination with Papanicolaou smear of cervix ASC-H with HPV HR Positive on 06/02/2021. Colpo 09/06/2021 Severe Cervical Dysplasia (CIN-3).  Patient was informed at that time that she  needed a LEEP procedure.  Refused to schedule and didn't f/u until today.  Pap/HPV HR today. 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 Reviewed her chart thoroughly, no h/o LEEP found. Never received the HPV immunization. Immunosuppressed because of RA treatments.  Valacyclovir for recurrences of HSV. Prescription sent to pharmacy. C/O a bulge in the vagina, especially when her bladder is full.  No pain.  No SUI. Breasts normal. Mammo Neg 06/2022.  BMI 05/2021 Osteopenia, repeat BD 05/2023.  Colono 03/2016. Depression and frustration associated with Husband Dxed with Asperger's Syndrome who brought back that HR HPV a long time ago after a period of separation. Patient will continue with Psychotherapy.  Recommend Psychiatry with Anti-Depressive treatment.  No concrete plan to commit suicide.  - Cytology - PAP( [place]) - Vitamin D (25 hydroxy)  2. Severe dysplasia of cervix (CIN III) If Pap shows HGSIL, will proceed directly to Colpo/LEEP. - Cytology - PAP( [place])  3. Osteopenia of multiple sites Repeat BD in 05/2023. - Vitamin D (25 hydroxy)  4. Postmenopausal Well on no HRT.  5. Rheumatoid arthritis involving both hands with positive rheumatoid factor (HCC) - Creatinine - CBC w/Diff - AST - ALT - Albumin  Other orders - leucovorin (WELLCOVORIN) 5 MG tablet; Take by mouth. - valACYclovir (VALTREX) 500 MG tablet; Take two tabs po daily x 5 days.   Genia Del MD, 2:09 PM

## 2023-03-17 ENCOUNTER — Encounter: Payer: Self-pay | Admitting: Obstetrics & Gynecology

## 2023-03-17 LAB — ALBUMIN: Albumin: 4.4 g/dL (ref 3.6–5.1)

## 2023-03-17 LAB — CBC WITH DIFFERENTIAL/PLATELET
Absolute Monocytes: 467 cells/uL (ref 200–950)
Basophils Absolute: 38 cells/uL (ref 0–200)
Basophils Relative: 0.6 %
Eosinophils Absolute: 51 cells/uL (ref 15–500)
Eosinophils Relative: 0.8 %
HCT: 38.5 % (ref 35.0–45.0)
Hemoglobin: 13.3 g/dL (ref 11.7–15.5)
Lymphs Abs: 1094 cells/uL (ref 850–3900)
MCH: 33.1 pg — ABNORMAL HIGH (ref 27.0–33.0)
MCHC: 34.5 g/dL (ref 32.0–36.0)
MCV: 95.8 fL (ref 80.0–100.0)
MPV: 11.8 fL (ref 7.5–12.5)
Monocytes Relative: 7.3 %
Neutro Abs: 4749 cells/uL (ref 1500–7800)
Neutrophils Relative %: 74.2 %
Platelets: 234 10*3/uL (ref 140–400)
RBC: 4.02 10*6/uL (ref 3.80–5.10)
RDW: 12.9 % (ref 11.0–15.0)
Total Lymphocyte: 17.1 %
WBC: 6.4 10*3/uL (ref 3.8–10.8)

## 2023-03-17 LAB — VITAMIN D 25 HYDROXY (VIT D DEFICIENCY, FRACTURES): Vit D, 25-Hydroxy: 34 ng/mL (ref 30–100)

## 2023-03-17 LAB — CREATININE, SERUM: Creat: 0.79 mg/dL (ref 0.50–1.05)

## 2023-03-17 LAB — AST: AST: 24 U/L (ref 10–35)

## 2023-03-17 LAB — ALT: ALT: 37 U/L — ABNORMAL HIGH (ref 6–29)

## 2023-03-17 NOTE — Telephone Encounter (Signed)
Spoke with patient. See result note dated 03/17/23.   Encounter closed.

## 2023-03-27 LAB — CYTOLOGY - PAP
Comment: NEGATIVE
Diagnosis: HIGH — AB
High risk HPV: POSITIVE — AB

## 2023-05-16 ENCOUNTER — Telehealth: Payer: Self-pay | Admitting: *Deleted

## 2023-05-16 NOTE — Telephone Encounter (Signed)
See pap results and recommendations dated 03/16/23.   Patient read MyChart message regarding results and recommendations dated 04/14/23. No response from patient.   Letter pended. Routing to Dr. Edward Jolly to review.   Dr. Edward Jolly -please review pended letter under "communications" and update if needed.

## 2023-05-18 NOTE — Telephone Encounter (Signed)
I have reviewed the patient's results.   I recommend scheduling a colposcopy and endometrial biopsy on the same day. Her pap showed HGSIL and atypical glandular cells.  Her high risk HPV was positive.   Please modify a letter to the patient to reflect these recommendations.   Treatment recommendations will be made based on the results.

## 2023-05-19 NOTE — Telephone Encounter (Signed)
Left message to call Noreene Larsson, RN at [facility], [phone], option 5. Left detailed message, ok per dpr, advising f/u on abnormal PAP and recommendations.

## 2023-05-25 ENCOUNTER — Encounter: Payer: Self-pay | Admitting: Obstetrics and Gynecology

## 2023-05-26 NOTE — Telephone Encounter (Signed)
Please send letter to patient with my recommendations for pap, high risk HPV testing, colposcopy and endometrial biopsy.   Her pap from May, 2024 showed high grade cervical dysplasia and atypical glandular cells.  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   She is at risk of precancer and cancer of the cervix and possibly the uterus.  Evaluation is indicated to provide a diagnosis and recommendations for treatment. This will improve outcome for her health.   Please inquire in the letter if the patient has barriers to receiving care.

## 2023-06-02 NOTE — Telephone Encounter (Signed)
Letter pended and routed to Dr. Edward Jolly to review.

## 2023-06-05 NOTE — Telephone Encounter (Signed)
Letter sent by MyChart, Letter mailed by standard [place] mail on 06/02/23.   Encounter closed.

## 2023-06-05 NOTE — Telephone Encounter (Signed)
Letter was signed and I believe mailed last week.

## 2023-08-03 ENCOUNTER — Ambulatory Visit: Payer: Managed Care, Other (non HMO) | Admitting: Internal Medicine

## 2023-08-03 ENCOUNTER — Encounter: Payer: Self-pay | Admitting: Internal Medicine

## 2023-08-03 VITALS — BP 120/70 | HR 87 | Ht 61.75 in | Wt 135.0 lb

## 2023-08-03 DIAGNOSIS — E559 Vitamin D deficiency, unspecified: Secondary | ICD-10-CM | POA: Diagnosis not present

## 2023-08-03 DIAGNOSIS — M8589 Other specified disorders of bone density and structure, multiple sites: Secondary | ICD-10-CM

## 2023-08-03 DIAGNOSIS — E063 Autoimmune thyroiditis: Secondary | ICD-10-CM

## 2023-08-03 NOTE — Progress Notes (Addendum)
Patient ID: Tiffany Jefferson, female   DOB: 09-Nov-1960, 62 y.o.   MRN: [id number]  HPI  Tiffany Jefferson is a 62 y.o.-year-old female, initially referred by her ObGyn Dr., Dr. Audie Box, returning for follow-up for hypothyroidism 2/2 Hashimoto's thyroiditis and osteopenia. Last visit 1 year ago.  Interim hx: No fractures since last visit. No dizziness/vertigo/vision problems.  Reviewed and addended history: Pt. has had a high TSH since ~2013; we started Levothyroxine 25 mcg daily in 09/2015.  We increased the dose since then.  We switched to brand name Synthroid for more consistent dosing. She was paying 120 >> 180$ for 3 mo >> now getting this from [facility] program (75$).  Reviewed her TFTs: Lab Results  Component Value Date   TSH 1.11 08/25/2022   TSH 0.36 08/26/2021   TSH 0.91 08/21/2020   TSH 0.72 08/20/2019   TSH 0.95 10/26/2018   TSH 3.19 09/12/2018   TSH 4.15 09/29/2017   TSH 3.13 03/14/2017   TSH 0.59 01/31/2017   TSH 0.36 09/23/2016   FREET4 1.09 08/25/2022   FREET4 1.07 08/26/2021   FREET4 1.22 08/21/2020   FREET4 1.15 08/20/2019   FREET4 1.08 10/26/2018   FREET4 1.00 09/12/2018   FREET4 1.1 09/29/2017   FREET4 1.08 03/14/2017   FREET4 1.13 01/31/2017   FREET4 1.11 09/23/2016  08/21/2015: TSH 7.510, TT4 7.6 (0.45-4.5) (wellness screen at work)  Pt is on Synthroid d.a.w. 75 mcg daily, taken: - in am (at 7 AM) >> 1h later she takes [place] - fasting - at least 30 min from b'fast - no calcium - no iron - + multivitamins at lunchtime - intermittently - no PPIs  Pt denies: - feeling nodules in neck - hoarseness - choking She has occasional dysphagia when eats too fast or (Basmati) rice, raw carrots.  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  She has + FH of thyroid disorders in: father - hypothyroidism, maternal aunts and cousins. No FH of thyroid cancer. No h/o radiation tx to head or neck. No herbal supplements. + B complex - not in  weeks. No recent steroids use.   She also has a history of RA (on Plaquenil, Folic acid). Tried Humira >> had severe numbness, tingling, rash. Currently on Xeljanz po, on which she has better mobility and less fatigue.  Reviewed  her latest bone density report ([facility], 06/10/2021): LUMBAR SPINE:  The bone mineral density of the lumbar spine from L1 through L4 is 0.902 g/cm2.  T-score: -2.4  Z-score: -1.0  Omitted due to degenerative disease: None.  Comparison: No comparison.   PROXIMAL FEMUR:  The bone mineral density as measured at the left femoral neck region of interest is 0.726 g/cm2.  T-score: -2.2  Z-score: -0.9  Comparison: No comparison.   Calibrated bone mineral density of the distal left radius is 0.691 g/sq cm. This is 2.1 standard deviations below the mean for young normals  The 10 year probability of a major osteoporotic fracture is 10.4%. 10 year probability of a hip fracture is 1.7%.  Rheumatologist suggested Reclast >> not covered. ObGyn suggested Fosamax >> not started as she was concerned about possible SEs.  ROS: + see HPI  I reviewed pt's medications, allergies, PMH, social hx, family hx, and changes were documented in the history of present illness. Otherwise, unchanged from my initial visit note.  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    LGSIL (low grade squamous intraepithelial dysplasia) 03/2013, 02/2014,2020   Colposcopic biopsy proven   Osteopenia    Rheumatoid arthritis(714.0) 2002   Past Surgical History:  Procedure Laterality Date   BREAST EXCISIONAL BIOPSY Left    BREAST SURGERY     Benign fibroid mass   CERVICAL BIOPSY  W/ LOOP ELECTRODE EXCISION  11/11   CIN II positive  endocervical margin   COLONOSCOPY     FINGER SURGERY  2002   Social History   Social History   Marital Status: Married    Spouse Name: Tiffany Jefferson   Number of Children: 2   Occupational History   Chemical engineer   Social History Main Topics   Smoking status: Never Smoker    Smokeless tobacco: Never Used   Alcohol Use: No   Drug Use: No   Sexual Activity:    Partners: Male    Pharmacist, hospital Protection: Other-see comments     Comment: vasectomy   Current Outpatient Medications on File Prior to Visit  Medication Sig Dispense Refill   leucovorin (WELLCOVORIN) 5 MG tablet Take by mouth.     methotrexate (RHEUMATREX) 2.5 MG tablet Take 20 mg by mouth once a week. Caution:Chemotherapy. Protect from light.      SYNTHROID 75 MCG tablet Take 1 tablet (75 mcg total) by mouth daily before breakfast. 90 tablet 3   Tofacitinib Citrate (XELJANZ) 5 MG TABS Take 5 mg by mouth 2 (two) times daily.      valACYclovir (VALTREX) 500 MG tablet Take two tabs po daily x 5 days. 10 tablet 4   No current facility-administered medications on file prior to visit.   Allergies  Allergen Reactions   Codeine Nausea And Vomiting       Family History  Problem Relation Age of Onset   Cancer Father        COLON   Heart block Father    PE: BP 120/70   Pulse 87   Ht 5' 1.75" (1.568 m)   Wt 135 lb (61.2 kg)   LMP 10/25/2007 Comment: sexually active  SpO2 99%   BMI 24.89 kg/m   Wt Readings from Last 3 Encounters:  08/03/23 135 lb (61.2 kg)  03/16/23 138 lb (62.6 kg)  08/25/22 134 lb 3.2 oz (60.9 kg)   Constitutional: normal weight, in NAD Eyes:  EOMI, no exophthalmos ENT: no neck masses, no cervical lymphadenopathy Cardiovascular: RRR, No MRG Respiratory: CTA B Musculoskeletal: + B hand deformities (enlargement as metacarpophalangeal joints) Skin:no rashes Neurological: no tremor with outstretched hands  ASSESSMENT: 1. Hashimoto's Hypothyroidism  2. Osteopenia  3.  Vitamin D  insufficiency  PLAN:  1. Patient withHashimoto's hypothyroidism, on brand-name Synthroid - latest thyroid labs reviewed with pt. >> normal: Lab Results  Component Value Date   TSH 1.11 08/25/2022  - she continues on Synthroid DAW 75 mcg daily - pt feels good on this dose. - we discussed about taking the thyroid hormone every day, with water, >30 minutes before breakfast, separated by >4 hours from acid reflux medications, calcium, iron, multivitamins. Pt. is taking it correctly. - will check thyroid tests today: TSH and fT4 - If labs are abnormal, she will need to return for repeat TFTs in 1.5 months - OTW, I will see her back in a  year  2. Osteopenia -No falls or fractures -Bone density results from 06/10/2021 [facility]) were reviewed.  These showed osteopenia.  In the past, she remembers being told that she had osteoporosis at the level of the forearm, but I do not have these results.  We did discuss that her risk of is higher in the setting of rheumatoid arthritis even if she is not on steroids.  She was previously recommended Reclast by her rheumatologist but this was not covered by insurance.  OB/GYN suggested Fosamax but she was reticent to start this due to the risk of osteonecrosis of the jaw.  We discussed that the risk is extremely low but she remains reticent about starting the medication.  She was open to the idea to get another bone density scan in 2 years from the previous and then see if she needed to start medication -At last visits, I recommended to get 1200 mg of calcium daily from the diet + supplements.  I also recommended weightbearing exercises. -She is due for another bone density scan now - advised her to get this at [facility], on the same densitometer.  The FRAX score needs to be adjusted for the presence of RA.  3.  Vitamin D insufficiency -At last visit, vitamin D was 26.83, slightly low.  I advised her to try to take the 4000 units vitamin daily supplement consistently,  however, she tells me that she is now off completely -I advised her to try to set reminders or use a pillbox to take it daily -We will repeat the level today  Needs refills.   Component     Latest Ref Rng 08/03/2023  TSH     0.35 - 5.50 uIU/mL 0.66   T4,Free(Direct)     0.60 - 1.60 ng/dL 4.09   VITD     81.19 - 100.00 ng/mL 21.82 (L)   TFTs are normal, but vitamin D is even lower than before.  I will advise her to restart the 4000 units vitamin D daily.  08/18/2023:  The T-score appears to be lower at the spine, decreased from -2.4 to -2.7.  At the level of the left femoral neck, the T-score decreased from -2.2 to -2.4, unclear if this is a significant decrease. Since her T-scores are in the osteoporosis range, I would suggest medication for now.  If she is reticent to try bisphosphonate, we can try Prolia.  I will check with her.  Carlus Pavlov, MD PhD [facility] Endocrinology

## 2023-08-03 NOTE — Patient Instructions (Signed)
Please stop at the lab.  Please continue Synthroid 75 mcg daily.  Take the thyroid hormone every day, with water, at least 30 minutes before breakfast, separated by at least 4 hours from: - acid reflux medications - calcium - iron - multivitamins  Please come back for a follow-up appointment in 1 year.

## 2023-08-04 LAB — T4, FREE: Free T4: 1.2 ng/dL (ref 0.60–1.60)

## 2023-08-04 LAB — VITAMIN D 25 HYDROXY (VIT D DEFICIENCY, FRACTURES): VITD: 21.82 ng/mL — ABNORMAL LOW (ref 30.00–100.00)

## 2023-08-04 LAB — TSH: TSH: 0.66 u[IU]/mL (ref 0.35–5.50)

## 2023-08-04 MED ORDER — SYNTHROID 75 MCG PO TABS: 75.0000 ug | ORAL_TABLET | Freq: Every day | ORAL | 3 refills | Status: DC

## 2023-08-25 ENCOUNTER — Encounter: Payer: Self-pay | Admitting: Internal Medicine

## 2024-08-01 ENCOUNTER — Ambulatory Visit: Payer: Managed Care, Other (non HMO) | Admitting: Internal Medicine

## 2024-08-01 ENCOUNTER — Encounter: Payer: Self-pay | Admitting: Internal Medicine

## 2024-08-01 ENCOUNTER — Other Ambulatory Visit

## 2024-08-01 VITALS — BP 120/60 | HR 91 | Ht 61.75 in | Wt 138.2 lb

## 2024-08-01 DIAGNOSIS — E063 Autoimmune thyroiditis: Secondary | ICD-10-CM

## 2024-08-01 DIAGNOSIS — E559 Vitamin D deficiency, unspecified: Secondary | ICD-10-CM | POA: Diagnosis not present

## 2024-08-01 DIAGNOSIS — M8589 Other specified disorders of bone density and structure, multiple sites: Secondary | ICD-10-CM

## 2024-08-01 NOTE — Progress Notes (Signed)
 Patient ID: Tiffany Jefferson, female   DOB: Nov 26, 1960, 63 y.o.   MRN: [id number]  HPI  Tiffany Jefferson is a 63 y.o.-year-old female, initially referred by her ObGyn Dr., Dr. Rockney, returning for follow-up for hypothyroidism 2/2 Hashimoto's thyroiditis and osteoporosis. Last visit 1 year ago. Since last visit, she moved to [place]  to be closer to her daughters.  Interim hx: No fractures since last visit. No dizziness/vertigo/vision problems. She started to go to the gym 6 weeks ago >> met with a trainer initially. Now going 3x a week, lifting weights.  She feels stronger.  Reviewed history: Pt. has had a high TSH since ~2013; we started Levothyroxine  25 mcg daily in 09/2015.  We increased the dose since then.  We switched to brand name Synthroid  for more consistent dosing. She was paying 120 >> 180$ for 3 mo >> now getting this from [facility]  Delivers program (75$).  Reviewed her TFTs: Lab Results  Component Value Date   TSH 0.66 08/03/2023   TSH 1.11 08/25/2022   TSH 0.36 08/26/2021   TSH 0.91 08/21/2020   TSH 0.72 08/20/2019   TSH 0.95 10/26/2018   TSH 3.19 09/12/2018   TSH 4.15 09/29/2017   TSH 3.13 03/14/2017   TSH 0.59 01/31/2017   FREET4 1.20 08/03/2023   FREET4 1.09 08/25/2022   FREET4 1.07 08/26/2021   FREET4 1.22 08/21/2020   FREET4 1.15 08/20/2019   FREET4 1.08 10/26/2018   FREET4 1.00 09/12/2018   FREET4 1.1 09/29/2017   FREET4 1.08 03/14/2017   FREET4 1.13 01/31/2017  08/21/2015: TSH 7.510, TT4 7.6 (0.45-4.5) (wellness screen at work)  Pt is on Synthroid  d.a.w. 75 mcg daily, taken: - in am (at 7 AM)  - fasting - at least 30 min from b'fast - no calcium - no iron - + multivitamins at lunchtime - intermittently - no PPIs  Pt denies: - feeling nodules in neck - hoarseness - choking She has occasional dysphagia when eats too fast or (Basmati) rice, raw carrots.  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  She has + FH of thyroid   disorders in: father - hypothyroidism, maternal aunts and cousins. No FH of thyroid  cancer. No h/o radiation tx to head or neck. No herbal supplements. + B complex - not in weeks. No recent steroids use.   She also has a history of RA (on Plaquenil, Folic acid). Tried Humira >> had severe numbness, tingling, rash. Currently on Xeljanz po, on which she has better mobility and less fatigue.  Reviewed  her bone density report ([facility], 06/10/2021): LUMBAR SPINE:  The bone mineral density of the lumbar spine from L1 through L4 is 0.902 g/cm2.  T-score: -2.4  Z-score: -1.0  Omitted due to degenerative disease: None.  Comparison: No comparison.   PROXIMAL FEMUR:  The bone mineral density as measured at the left femoral neck region of interest is 0.726 g/cm2.  T-score: -2.2  Z-score: -0.9  Comparison: No comparison.   Calibrated bone mineral density of the distal left radius is 0.691 g/sq cm. This is 2.1 standard deviations below the mean for young normals  The 10 year probability of a major osteoporotic fracture is 10.4%. 10 year probability of a hip fracture is 1.7%.  Rheumatologist suggested Reclast >> not covered. ObGyn suggested Fosamax  >> not started as she was concerned about possible SEs.  08/18/2023:  After the above results returned, since she had a diagnosis of osteoporosis, I suggested bisphosphonates or Prolia.  She did not  decide for these but increased exercise (as mentioned above).  Reviewed her vitamin D  levels: 04/24/2024: Vitamin D  27.3 Lab Results  Component Value Date   VD25OH 21.82 (L) 08/03/2023   VD25OH 34 03/16/2023   VD25OH 26.83 (L) 08/25/2022   VD25OH 43 06/03/2021   VD25OH 34 04/09/2020   VD25OH 29 (L) 03/04/2020   VD25OH 30 11/08/2018   VD25OH 41 11/07/2016   VD25OH 24 (L) 07/26/2016   VD25OH 27 (L) 07/20/2015   Previously on 4000 units of vitamin D  daily, but she kept forgetting these.  Currently only on 2000 units daily, dose decreased by  rheumatology.  ROS: + see HPI  I reviewed pt's medications, allergies, PMH, social hx, family hx, and changes were documented in the history of present illness. Otherwise, unchanged from my initial visit note.  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, 02/2014,2020   Colposcopic biopsy proven   Osteopenia    Rheumatoid arthritis(714.0) 2002   Past Surgical History:  Procedure Laterality Date   BREAST EXCISIONAL BIOPSY Left    BREAST SURGERY     Benign fibroid mass   CERVICAL BIOPSY  W/ LOOP ELECTRODE EXCISION  11/11   CIN II positive endocervical margin   COLONOSCOPY     FINGER SURGERY  2002   Social History   Social History   Marital Status: Married    Spouse Name: N/A   Number of Children: 2   Occupational History   Chemical engineer   Social History Main Topics   Smoking status: Never Smoker    Smokeless tobacco: Never Used   Alcohol Use: No   Drug Use: No   Sexual Activity:    Partners: Male    Pharmacist, hospital Protection: Other-see comments     Comment: vasectomy   Current Outpatient Medications on File Prior to Visit  Medication Sig Dispense Refill   leucovorin (WELLCOVORIN) 5 MG tablet Take by mouth.     methotrexate (RHEUMATREX) 2.5 MG tablet Take 20 mg by mouth once a week. Caution:Chemotherapy. Protect from light.      SYNTHROID  75 MCG tablet Take 1 tablet (75 mcg total) by mouth daily before breakfast. 90 tablet 3   Tofacitinib Citrate (XELJANZ) 5 MG TABS Take 5 mg by mouth 2 (two) times daily.      valACYclovir  (VALTREX ) 500 MG tablet Take two tabs po daily x 5 days. 10 tablet 4   No current facility-administered medications on  file prior to visit.   Allergies  Allergen Reactions   Codeine Nausea And Vomiting       Family History  Problem Relation Age of Onset   Cancer Father        COLON   Heart block Father    PE: BP 120/60   Pulse 91   Ht 5' 1.75 (1.568 m)   Wt 138 lb 3.2 oz (62.7 kg)   LMP 10/25/2007 Comment: sexually active  SpO2 97%   BMI 25.48 kg/m   Wt Readings from Last 3 Encounters:  08/01/24 138 lb 3.2 oz (62.7 kg)  08/03/23 135 lb (61.2 kg)  03/16/23 138 lb (62.6 kg)   Constitutional: normal weight, in  NAD Eyes:  EOMI, no exophthalmos ENT: no neck masses, no cervical lymphadenopathy Cardiovascular: RRR, No MRG Respiratory: CTA B Musculoskeletal: + B hand deformities (enlargement as metacarpophalangeal joints) Skin:no rashes Neurological: no tremor with outstretched hands  ASSESSMENT: 1. Hashimoto's Hypothyroidism  2. Osteopenia  3.  Vitamin D  insufficiency  PLAN:  1. Patient withHashimoto's hypothyroidism, on brand-name Synthroid  - latest thyroid  labs reviewed with pt. >> normal: Lab Results  Component Value Date   TSH 0.66 08/03/2023  - she continues on Synthroid  75 mcg daily - pt feels good on this dose. - we discussed about taking the thyroid  hormone every day, with water, >30 minutes before breakfast, separated by >4 hours from acid reflux medications, calcium, iron, multivitamins. Pt. is taking it correctly. - will check thyroid  tests today: TSH and fT4 - If labs are abnormal, she will need to return for repeat TFTs in 1.5 months - OTW, I will see her back in a year  2. Osteopenia - No falls or fractures since last visit. - Bone density results from 06/10/2021 [facility]) were reviewed.  These showed osteopenia.  In the past, she remembers being told that she had osteoporosis at the level of the forearm, but I do not have these results.  We did discuss that her risk of is higher in the setting of rheumatoid arthritis even if she is not on steroids.  She was previously  recommended Reclast by her rheumatologist but this was not covered by insurance.  OB/GYN suggested Fosamax  but she was reticent to start this due to the risk of osteonecrosis of the jaw.  We discussed that the risk is extremely low but she remains reticent about starting the medication.  She was open to the idea to get another bone density scan in 2 years from the previous and then see if she needed to start medication - I recommended to get 1200 mg of calcium daily from the diet + supplements.  I also recommended weightbearing exercises. She started to do them. - We reviewed her bone density scan obtained after our last visit: T-scores were lower at the level of the spine (decreased from -2.4 to -2.7) and at the lower of the left femoral neck (decreased from -2.2 to -2.4 -unclear if this was a significant decrease).  Since the spine T-score was in the osteoporotic range, I suggested medication.  I advised her that if she was reticent to try bisphosphonate, we could try Prolia for her.  However, she decided against these for now, but started strength training 6 weeks ago.  She plans to continue for another year and then had another bone density. - We also discussed about [facility] - for skeletal loading and possibly REMS scan to check a bone fragility score-given brochure  3.  Vitamin D  insufficiency - At last visit she was supposed to be on 4000 units vitamin D  daily but she was off the supplement.  Vitamin D  level was 21.82, low. Most recent level was better, but still slightly low 27.3 (04/24/2024) - on 2000 units daily, dose decreased by her rheumatologist.. - I advised her to  increase the supplement to 4000 units daily - I previously advised her to try to set reminders on her phone or to have a pillbox  Even though she moved to [place] , she prefers to come here for the appointments at least for now.  [facility]  Lela Fendt, MD PhD [facility]  Endocrinology

## 2024-08-01 NOTE — Patient Instructions (Addendum)
 Please stop at the lab.  Please continue Synthroid  75 mcg daily.  Take the thyroid  hormone every day, with water, at least 30 minutes before breakfast, separated by at least 4 hours from: - acid reflux medications - calcium - iron - multivitamins  Please increase vitamin D3 to 4000 units daily.  Please come back for a follow-up appointment in 1 year.

## 2024-08-02 ENCOUNTER — Ambulatory Visit: Payer: Self-pay | Admitting: Internal Medicine

## 2024-08-02 LAB — TSH: TSH: 1.25 m[IU]/L (ref 0.40–4.50)

## 2024-08-02 LAB — T4, FREE: Free T4: 1.3 ng/dL (ref 0.8–1.8)

## 2024-08-02 MED ORDER — SYNTHROID 75 MCG PO TABS: 75.0000 ug | ORAL_TABLET | Freq: Every day | ORAL | 3 refills | Status: AC

## 2024-08-02 NOTE — Addendum Note (Signed)
 Addended by: TRIXIE FILE on: 08/02/2024 08:44 AM   Modules accepted: Orders

## 2025-07-31 ENCOUNTER — Ambulatory Visit: Admitting: Internal Medicine
